# Patient Record
Sex: Female | Born: 1960
Health system: Southern US, Community
[De-identification: ages and names within clinical notes are randomized; demographics above are authoritative.]

## PROBLEM LIST (undated history)

## (undated) DIAGNOSIS — M1990 Unspecified osteoarthritis, unspecified site: Secondary | ICD-10-CM

## (undated) DIAGNOSIS — R748 Abnormal levels of other serum enzymes: Secondary | ICD-10-CM

## (undated) DIAGNOSIS — E7800 Pure hypercholesterolemia, unspecified: Secondary | ICD-10-CM

## (undated) DIAGNOSIS — I10 Essential (primary) hypertension: Secondary | ICD-10-CM

## (undated) DIAGNOSIS — G5600 Carpal tunnel syndrome, unspecified upper limb: Secondary | ICD-10-CM

## (undated) DIAGNOSIS — J45909 Unspecified asthma, uncomplicated: Secondary | ICD-10-CM

## (undated) DIAGNOSIS — E119 Type 2 diabetes mellitus without complications: Secondary | ICD-10-CM

## (undated) DIAGNOSIS — K760 Fatty (change of) liver, not elsewhere classified: Secondary | ICD-10-CM

## (undated) HISTORY — DX: Fatty (change of) liver, not elsewhere classified: K76.0

## (undated) HISTORY — DX: Pure hypercholesterolemia, unspecified: E78.00

## (undated) HISTORY — PX: TUBAL LIGATION: SHX77

## (undated) HISTORY — DX: Carpal tunnel syndrome, unspecified upper limb: G56.00

## (undated) HISTORY — DX: Abnormal levels of other serum enzymes: R74.8

---

## 2001-08-04 ENCOUNTER — Encounter: Payer: Self-pay | Admitting: Internal Medicine

## 2001-08-04 ENCOUNTER — Encounter: Admission: RE | Admit: 2001-08-04 | Discharge: 2001-08-04 | Payer: Self-pay | Admitting: Internal Medicine

## 2006-12-29 ENCOUNTER — Ambulatory Visit: Payer: Self-pay | Admitting: Pulmonary Disease

## 2007-01-05 DIAGNOSIS — J302 Other seasonal allergic rhinitis: Secondary | ICD-10-CM | POA: Insufficient documentation

## 2007-01-05 DIAGNOSIS — R05 Cough: Secondary | ICD-10-CM | POA: Insufficient documentation

## 2007-01-05 DIAGNOSIS — R059 Cough, unspecified: Secondary | ICD-10-CM | POA: Insufficient documentation

## 2007-01-21 ENCOUNTER — Ambulatory Visit: Payer: Self-pay | Admitting: Pulmonary Disease

## 2007-02-28 ENCOUNTER — Telehealth (INDEPENDENT_AMBULATORY_CARE_PROVIDER_SITE_OTHER): Payer: Self-pay | Admitting: *Deleted

## 2009-04-05 ENCOUNTER — Ambulatory Visit (HOSPITAL_COMMUNITY): Admission: RE | Admit: 2009-04-05 | Discharge: 2009-04-05 | Payer: Self-pay | Admitting: Family Medicine

## 2009-06-10 ENCOUNTER — Emergency Department (HOSPITAL_COMMUNITY): Admission: EM | Admit: 2009-06-10 | Discharge: 2009-06-10 | Payer: Self-pay | Admitting: Emergency Medicine

## 2009-06-10 ENCOUNTER — Encounter: Payer: Self-pay | Admitting: Orthopedic Surgery

## 2009-06-17 ENCOUNTER — Encounter (INDEPENDENT_AMBULATORY_CARE_PROVIDER_SITE_OTHER): Payer: Self-pay | Admitting: *Deleted

## 2009-06-17 ENCOUNTER — Ambulatory Visit: Payer: Self-pay | Admitting: Orthopedic Surgery

## 2009-06-17 DIAGNOSIS — M25819 Other specified joint disorders, unspecified shoulder: Secondary | ICD-10-CM | POA: Insufficient documentation

## 2009-06-17 DIAGNOSIS — M5412 Radiculopathy, cervical region: Secondary | ICD-10-CM | POA: Insufficient documentation

## 2009-06-17 DIAGNOSIS — M7580 Other shoulder lesions, unspecified shoulder: Secondary | ICD-10-CM

## 2009-07-02 ENCOUNTER — Encounter (INDEPENDENT_AMBULATORY_CARE_PROVIDER_SITE_OTHER): Payer: Self-pay | Admitting: *Deleted

## 2009-07-02 ENCOUNTER — Ambulatory Visit: Payer: Self-pay | Admitting: Orthopedic Surgery

## 2009-07-05 ENCOUNTER — Encounter: Payer: Self-pay | Admitting: Orthopedic Surgery

## 2009-07-05 ENCOUNTER — Telehealth: Payer: Self-pay | Admitting: Orthopedic Surgery

## 2009-07-09 ENCOUNTER — Encounter: Admission: RE | Admit: 2009-07-09 | Discharge: 2009-07-09 | Payer: Self-pay | Admitting: Orthopedic Surgery

## 2009-07-12 ENCOUNTER — Telehealth: Payer: Self-pay | Admitting: Orthopedic Surgery

## 2009-07-15 ENCOUNTER — Encounter: Payer: Self-pay | Admitting: Orthopedic Surgery

## 2009-07-15 ENCOUNTER — Telehealth: Payer: Self-pay | Admitting: Orthopedic Surgery

## 2009-07-18 ENCOUNTER — Encounter (INDEPENDENT_AMBULATORY_CARE_PROVIDER_SITE_OTHER): Payer: Self-pay | Admitting: *Deleted

## 2009-07-18 ENCOUNTER — Telehealth: Payer: Self-pay | Admitting: Orthopedic Surgery

## 2009-07-19 ENCOUNTER — Encounter (INDEPENDENT_AMBULATORY_CARE_PROVIDER_SITE_OTHER): Payer: Self-pay | Admitting: *Deleted

## 2009-07-29 ENCOUNTER — Telehealth (INDEPENDENT_AMBULATORY_CARE_PROVIDER_SITE_OTHER): Payer: Self-pay | Admitting: *Deleted

## 2009-08-01 ENCOUNTER — Encounter: Admission: RE | Admit: 2009-08-01 | Discharge: 2009-08-01 | Payer: Self-pay | Admitting: Orthopedic Surgery

## 2010-03-04 NOTE — Assessment & Plan Note (Signed)
Summary: [facility] ER FOL/UP/RT SHOULDER PAIN/XR AP 06/10/09/BCBS/CAF   Vital Signs:  Patient profile:   50 year old female Height:      68 inches Weight:      287 pounds Pulse rate:   80 / minute Resp:     16 per minute  Visit Type:  new patient Referring Provider:  ap er Primary Provider:  Dr. Renard Matter  CC:  right shoulder pain.  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  Pain is relieved by lying flat on her back.  She reports a deep throbbing sharp pain which is a great 10.  She went to the emergency room she was started on Ultram ER and ibuprofen 800 mg which did not help.  She denies numbness.  The pain is constant and worse when she bends over and when she reaches up to pick something up.  Pain started May 9  AP xrays rt shoulder and c spine 06/10/09 for review.  Meds: none.   Allergies: 1)  ! Sulfa  Past History:  Past Medical History: na  Past Surgical History: tubal ligation  Family History: Family History Coronary Heart Disease female < 28 Hx, family, asthma  Social History: Patient is married.  spinner no smoking no alcohol caffeine use daily regularly  Review of Systems Constitutional:  Denies weight loss, weight gain, fever, chills, and fatigue. Cardiovascular:  Denies chest pain, palpitations, fainting, and murmurs. Respiratory:  Complains of wheezing and couch; denies short of breath, tightness, pain on inspiration, and snoring . Gastrointestinal:  Denies heartburn, nausea, vomiting, diarrhea, constipation, and blood in your stools. Genitourinary:  Denies frequency, urgency, difficulty urinating, painful urination, flank pain, and bleeding in urine. Neurologic:  Complains of numbness; denies tingling, unsteady gait, dizziness, tremors, and seizure. Musculoskeletal:  Complains of muscle pain; denies joint  pain, swelling, instability, stiffness, redness, and heat. Endocrine:  Denies excessive thirst, exessive urination, and heat or cold intolerance. Psychiatric:  Denies nervousness, depression, anxiety, and hallucinations. Skin:  Denies changes in the skin, poor healing, rash, itching, and redness. HEENT:  Denies blurred or double vision, eye pain, redness, and watering. Immunology:  Complains of seasonal allergies; denies sinus problems and allergic to bee stings. Hemoatologic:  Denies easy bleeding and brusing.  Physical Exam  Additional Exam:  Constitutional: vital signs see recorded values. General: normal development, nutrition, and grooming. No deformity. Body Habitus is large CDV: Observation and palpation was normal  Lymph: palpation of the lymph nodes were normal Skin: inspection and palpation of the skin revealed no abnormalities  Neuro: coordination: normal              DTR's normal              Sensation was normal  Psyche: Alert and oriented x 3. Mood was normal.  Affect: normal  MSK: Gait: normal   She really had painless range of motion in the RIGHT shoulder and it was full.  There is no strength deficit.  There is no instability.  There is no tenderness  The cervical spine exam was abnormal with tenderness at the base of the cervical spine pain with flexion and cervical spine pain with extension referred to the cervical spine pain with rotation to the RIGHT, there is a positive Spurling sign is increased muscle tension on the RIGHT     Impression &  Recommendations:  Problem # 1:  CERVICAL RADICULITIS (ICD-723.4) Assessment New  x-rays cervical spine normal x-rays shoulder normal these were done at the hospital  Differential diagnoses cervical radiculopathy, RIGHT shoulder impingement  Orders: New Patient Level III (20254)  Problem # 2:  IMPINGEMENT SYNDROME (ICD-726.2) Assessment: New  recommend nonoperative treatment with a prednisone dosepak and Neurontin 100  mg t.i.d. followup 2 weeks  Orders: New Patient Level III (27062)  Medications Added to Medication List This Visit: 1)  Prednisone (pak) 10 Mg Tabs (Prednisone) .... As directed for 12 days 2)  Neurontin 100 Mg Caps (Gabapentin) .... One by mouth tid  Patient Instructions: 1)  Try new medications 2)  come back in 2 weeks to recheck neck after meds 3)  note needs to be from 06/10/09 til 07/01/09 Prescriptions: NEURONTIN 100 MG CAPS (GABAPENTIN) one by mouth tid  #90 x 0   Entered and Authorized by:   Fuller Canada MD   Signed by:   Fuller Canada MD on 06/17/2009   Method used:   Faxed to ...       [facility] (retail)       [address]       [place], [place]  [address]       Ph: [phone]       Fax: [phone]   RxID:   [id number] PREDNISONE (PAK) 10 MG TABS (PREDNISONE) as directed for 12 days  #1 pack x 0   Entered and Authorized by:   Fuller Canada MD   Signed by:   Fuller Canada MD on 06/17/2009   Method used:   Faxed to ...       [facility] (retail)       [address]       [place], [place]  [address]       Ph: [phone]       Fax: [phone]   RxID:   [id number]

## 2010-03-04 NOTE — Progress Notes (Signed)
  Phone Note Call from Patient   Summary of Call: Patient called about her appointment that was scheduled for MRI  results for 07/30/09.  She said she had been told by someone here that she did not need this appointment. She said she is going for her first ESI on Thursday 07/05/09.  Spoke with Dr. Romeo Apple about this and he said to cancel the appointment here, since she is already scheduled for the [facility].  Patient is aware that the appointment has been cancelled Initial call taken by: Jacklynn Ganong,  July 29, 2009 3:57 PM

## 2010-03-04 NOTE — Letter (Signed)
Summary: Out of Work  [facility]  [address]  [place], [place] [address]   Phone: [phone]  Fax: [phone]    Jun 17, 2009   Employee:  MAKENZE ELLETT    To Whom It May Concern:   For Medical reasons, please excuse the above named employee from work for the following dates:  Start:        Jun 10, 2009  End/Return to work:  Jul 02, 2009  (Next appointment scheduled Jul 02, 2009 4:15pm)  If you need additional information, please feel free to contact our office.         Sincerely,    Terrance Mass, MD

## 2010-03-04 NOTE — Miscellaneous (Signed)
Summary: c 6-7 esi order  Clinical Lists Changes  Orders: Added new Referral order of Misc. Referral (Misc. Ref) - Signed

## 2010-03-04 NOTE — Miscellaneous (Signed)
Summary: faxed info to [facility] for esi  Clinical Lists Changes

## 2010-03-04 NOTE — Letter (Signed)
Summary: Out of Work  [facility]  [address]  [place], [place] [address]   Phone: [phone]  Fax: [phone]    Jul 02, 2009   Employee:  Haley Mcgrath    To Whom It May Concern:   For Medical reasons, please excuse the above named employee from work for the following dates:  Start:   Jul 02, 2009  End:   August 18, 2009 *  *Further testing pending.  Follow up appointment pending results.    If you need additional information, please feel free to contact our office.         Sincerely,    Terrance Mass, MD

## 2010-03-04 NOTE — Letter (Signed)
Summary: History form  History form   Imported By: Jacklynn Ganong 06/18/2009 11:49:47  _____________________________________________________________________  External Attachment:    Type:   Image     Comment:   External Document

## 2010-03-04 NOTE — Progress Notes (Signed)
Summary: Call to patient, MRI results and new referral  ---- Converted from flag ---- ---- 07/18/2009 9:07 AM, Fuller Canada MD wrote: return to work, she will call to give you the date  C6-7 ESI ASAP ------------------------------

## 2010-03-04 NOTE — Progress Notes (Signed)
Summary: MRI appointment.  Phone Note Outgoing Call   Call placed by: Waldon Reining,  July 05, 2009 10:37 AM Call placed to: Patient Action Taken: Appt scheduled Summary of Call: I called to give the patient her MRI appointment at [facility] on 07-09-09 at 7:30 am. Patient is aware to bring a copy of her films. Patient has [employer]BS, no precert is needed.

## 2010-03-04 NOTE — Progress Notes (Signed)
Summary: Appt re-scheduled and forms re-faxed  Phone Note Call from Patient   Caller: Patient Summary of Call: Patient called about appt for MRi results (missed it today) and about forms. Re-sched'd her appointment and re-faxed disability forms to [facility] faxed by [facility] to Fax [phone], ph [phone] Initial call taken by: Cammie Sickle,  July 15, 2009 5:45 PM

## 2010-03-04 NOTE — Letter (Signed)
Summary: [employer] disab form re-fax  [employer] disab form re-fax   Imported By: Cammie Sickle 07/16/2009 09:09:57  _____________________________________________________________________  External Attachment:    Type:   Image     Comment:   External Document

## 2010-03-04 NOTE — Letter (Signed)
Summary: Out of Work  [facility]  [address]  [place], [place] [address]   Phone: [phone]  Fax: [phone]    July 18, 2009   Employee:  Haley Mcgrath    To Whom It May Concern:   For Medical reasons, please excuse the above named employee from work for the following dates:  Start:   Jul 02, 2009  End:   July 22, 2009   If you need additional information, please feel free to contact our office.         Sincerely,    Terrance Mass, MD

## 2010-03-04 NOTE — Letter (Signed)
Summary: [employer] disab form  [employer] disab form   Imported By: Cammie Sickle 07/16/2009 09:03:54  _____________________________________________________________________  External Attachment:    Type:   Image     Comment:   External Document

## 2010-03-04 NOTE — Progress Notes (Signed)
Summary: mri results  Phone Note Call from Patient Call back at Home Phone [phone]   Summary of Call: pt called wanting MRI results Initial call taken by: Chasity Tereasa Coop,  July 12, 2009 11:09 AM

## 2010-03-04 NOTE — Assessment & Plan Note (Signed)
Summary: 2 WK RE-CK RT SHOULDER/NECK/BCBS/CAF   Visit Type:  Follow-up Referring Provider:  ap er Primary Provider:  Dr. Renard Matter  CC:  right shoulder pain.  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  Pain is relieved by lying flat on her back.  She reports a deep throbbing sharp pain which is a great 10.  She went to the emergency room she was started on Ultram ER and ibuprofen 800 mg which did not help.  She denies numbness.  The pain is constant and worse when she bends over and when she reaches up to pick something up.  Pain started May 9  AP xrays rt shoulder and c spine 06/10/09 for review.  Meds: Ibuprofen 800 mg as needed.  Today is 2 week recheck right shoulder after taking Prednisone 10 and Neurontin 100 tid for cervical radiculitis.  Still has aching running down right arm but not below the wrist   Cervical pain improved the arm pain did not     Allergies (verified): 1)  ! 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 Musculoskeletal:  See HPI.  Physical Exam  Additional Exam:  Normal Appearance, Oriented x 3, Mood normal  Stability was normal  Motor grade was  Skin was intact no rash  She still has a significant amount of pain with forward elevation.  She is nontender in the cervical spine.  She can perform active forward elevation without too much pain     Impression &  Recommendations:  Problem # 1:  CERVICAL RADICULITIS (ICD-723.4)  MRI to evaluate for cervical disc  Orders: Est. Patient Level III ([address])  Problem # 2:  IMPINGEMENT SYNDROME (ICD-726.2)  injected RIGHT shoulder subacromial space Verbal consent obtained/The shoulder was injected with depomedrol 40mg /cc and sensorcaine .25% . There were no complications  Orders: Est. Patient Level III (60454) Depo- Medrol 40mg  (J1030) Joint Aspirate / Injection, Large (20610)  Medications Added to Medication List This Visit: 1)  Norco 5-325 Mg Tabs (Hydrocodone-acetaminophen) .Marland Kitchen.. 1 every 4 hrs as needed pain  Patient Instructions: 1)  You have received an injection of cortisone today. You may experience increased pain at the injection site. Apply ice pack to the area for 20 minutes every 2 hours and take 2 xtra strength tylenol every 8 hours. This increased pain will usually resolve in 24 hours. The injection will take effect in 3-10 days.   2)  MRI C SPINE  3)  new pain medication  4)  and increase the neurontin to 2 tab every 8 hrs  Prescriptions: NORCO 5-325 MG TABS (HYDROCODONE-ACETAMINOPHEN) 1 every 4 hrs as needed pain  #42 x 5   Entered and Authorized by:   Fuller Canada MD   Signed by:   Fuller Canada MD on 07/02/2009   Method used:   Print then Give to Patient   RxID:   [id number]

## 2011-10-22 ENCOUNTER — Emergency Department (HOSPITAL_COMMUNITY)
Admission: EM | Admit: 2011-10-22 | Discharge: 2011-10-22 | Disposition: A | Discharge status: Home / Self Care | Payer: BC Managed Care – PPO | Attending: Emergency Medicine | Admitting: Emergency Medicine

## 2011-10-22 ENCOUNTER — Encounter (HOSPITAL_COMMUNITY): Payer: Self-pay | Admitting: Emergency Medicine

## 2011-10-22 ENCOUNTER — Emergency Department (HOSPITAL_COMMUNITY): Payer: BC Managed Care – PPO

## 2011-10-22 DIAGNOSIS — M542 Cervicalgia: Secondary | ICD-10-CM | POA: Insufficient documentation

## 2011-10-22 DIAGNOSIS — J329 Chronic sinusitis, unspecified: Secondary | ICD-10-CM

## 2011-10-22 DIAGNOSIS — J40 Bronchitis, not specified as acute or chronic: Secondary | ICD-10-CM

## 2011-10-22 HISTORY — DX: Unspecified osteoarthritis, unspecified site: M19.90

## 2011-10-22 MED ORDER — ALBUTEROL SULFATE (5 MG/ML) 0.5% IN NEBU
5.0000 mg | INHALATION_SOLUTION | Freq: Once | RESPIRATORY_TRACT | Status: DC
  Filled 2011-10-22: qty 1

## 2011-10-22 MED ORDER — SODIUM CHLORIDE 0.9 % IN NEBU
INHALATION_SOLUTION | RESPIRATORY_TRACT | Status: AC
  Filled 2011-10-22: qty 3

## 2011-10-22 MED ORDER — HYDROCODONE-ACETAMINOPHEN 5-325 MG PO TABS: ORAL_TABLET | ORAL | Status: DC

## 2011-10-22 MED ORDER — METHYLPREDNISOLONE SODIUM SUCC 125 MG IJ SOLR
125.0000 mg | Freq: Once | INTRAMUSCULAR | Status: AC
  Administered 2011-10-22: 125 mg via INTRAMUSCULAR
  Filled 2011-10-22: qty 2

## 2011-10-22 MED ORDER — PSEUDOEPHEDRINE HCL 60 MG PO TABS: ORAL_TABLET | ORAL | Status: DC

## 2011-10-22 MED ORDER — ALBUTEROL SULFATE HFA 108 (90 BASE) MCG/ACT IN AERS
INHALATION_SPRAY | RESPIRATORY_TRACT | Status: AC
  Administered 2011-10-22: 2 via RESPIRATORY_TRACT
  Filled 2011-10-22: qty 6.7

## 2011-10-22 MED ORDER — ALBUTEROL SULFATE HFA 108 (90 BASE) MCG/ACT IN AERS
2.0000 | INHALATION_SPRAY | RESPIRATORY_TRACT | Status: DC
  Administered 2011-10-22 (×2): 2 via RESPIRATORY_TRACT
  Filled 2011-10-22: qty 6.7

## 2011-10-22 NOTE — ED Provider Notes (Signed)
History     CSN: [id number]  Arrival date & time 10/22/11  0900   First MD Initiated Contact with Patient 10/22/11 1005      Chief Complaint  Patient presents with  . Headache  . Neck Pain    (Consider location/radiation/quality/duration/timing/severity/associated sxs/prior treatment) Patient is a 51 y.o. female presenting with cough. The history is provided by the patient.  Cough This is a new problem. The current episode started more than 1 week ago. The problem occurs hourly. The problem has been gradually worsening. The cough is non-productive. There has been no fever. Associated symptoms include chills and myalgias. Pertinent negatives include no shortness of breath and no wheezing. She has tried decongestants and cough syrup for the symptoms. The treatment provided no relief. She is not a smoker. Her past medical history is significant for bronchitis.    Past Medical History  Diagnosis Date  . Arthritis     bilateral in hands    Past Surgical History  Procedure Date  . Tubal ligation     No family history on file.  History  Substance Use Topics  . Smoking status: Never Smoker   . Smokeless tobacco: Never Used  . Alcohol Use: No    OB History    Grav Para Term Preterm Abortions TAB SAB Ect Mult Living                  Review of Systems  Constitutional: Positive for chills. Negative for activity change.       All ROS Neg except as noted in HPI  HENT: Negative for nosebleeds.   Eyes: Negative for discharge.  Respiratory: Positive for cough. Negative for shortness of breath and wheezing.   Cardiovascular: Negative for palpitations.  Gastrointestinal: Negative for abdominal pain and blood in stool.  Genitourinary: Negative for dysuria, frequency and hematuria.  Musculoskeletal: Positive for myalgias and arthralgias. Negative for back pain.  Skin: Negative.   Neurological: Negative for dizziness, seizures and speech difficulty.  Psychiatric/Behavioral:  Negative for hallucinations and confusion.    Allergies  Sulfonamide derivatives  Home Medications  No current outpatient prescriptions on file.  BP 158/92  Pulse 81  Temp 98.2 F (36.8 C) (Oral)  Resp 20  Ht 5\' 7"  (1.702 m)  SpO2 100%  LMP 10/09/2011  Physical Exam  Nursing note and vitals reviewed. Constitutional: She is oriented to person, place, and time. She appears well-developed and well-nourished.  Non-toxic appearance.  HENT:  Head: Normocephalic.  Right Ear: Tympanic membrane and external ear normal.  Left Ear: Tympanic membrane and external ear normal.       Nasal  Congestion present  Eyes: EOM and lids are normal. Pupils are equal, round, and reactive to light.  Neck: Normal range of motion. Neck supple. Carotid bruit is not present.       Mild soreness with ROM. No rigidity.  Cardiovascular: Normal rate, regular rhythm, normal heart sounds, intact distal pulses and normal pulses.   Pulmonary/Chest: Breath sounds normal. No respiratory distress.       Course breath sounds. No focal consolidation. Few scattered rhonchi.  Abdominal: Soft. Bowel sounds are normal. There is no tenderness. There is no guarding.  Musculoskeletal: Normal range of motion.  Lymphadenopathy:       Head (right side): No submandibular adenopathy present.       Head (left side): No submandibular adenopathy present.    She has no cervical adenopathy.  Neurological: She is alert and oriented to person, place,  and time. She has normal strength. No cranial nerve deficit or sensory deficit.  Skin: Skin is warm and dry.  Psychiatric: She has a normal mood and affect. Her speech is normal.    ED Course  Procedures (including critical care time)  Labs Reviewed - No data to display Dg Chest 2 View  10/22/2011  *RADIOLOGY REPORT*  Clinical Data: Headache, cough, congestion, shortness of breath for several weeks  CHEST - 2 VIEW  Comparison: Chest x-ray of 11/06/2010  Findings: The lungs are clear.   Mediastinal contours appear stable. The heart is within normal limits in size.  No bony abnormality is noted.  IMPRESSION: Stable chest x-ray.  No active lung disease.   Original Report Authenticated By: Juline Patch, M.D.      No diagnosis found.    MDM  I have reviewed nursing notes, vital signs, and all appropriate lab and imaging results for this patient. Chest xray is negative for pneumonia or active disease. Albuterol inhaler given to patient. Rx for sudafed and norco given. Pt to see her MD at Urgent Care for recheck if not improving.       Kathie Dike, [place] 10/22/11 1029

## 2011-10-22 NOTE — ED Notes (Signed)
Pt went to urgent care and was dx with sinus infection, states that now she is having head pain that is radiating down her neck on the Rt side. Pt is having a non-productive cough. Pt reports pain at 8/10.

## 2011-10-22 NOTE — ED Provider Notes (Signed)
Medical screening examination/treatment/procedure(s) were performed by non-physician practitioner and as supervising physician I was immediately available for consultation/collaboration.   Diala Waxman L Eason Housman, MD 10/22/11 1335 

## 2011-11-13 ENCOUNTER — Ambulatory Visit: Payer: Self-pay | Admitting: Family Medicine

## 2011-11-26 ENCOUNTER — Ambulatory Visit (INDEPENDENT_AMBULATORY_CARE_PROVIDER_SITE_OTHER): Payer: BC Managed Care – PPO | Admitting: Family Medicine

## 2011-11-26 ENCOUNTER — Encounter: Payer: Self-pay | Admitting: Family Medicine

## 2011-11-26 VITALS — BP 140/84 | HR 86 | Resp 18 | Ht 66.5 in | Wt 289.1 lb

## 2011-11-26 DIAGNOSIS — T7840XA Allergy, unspecified, initial encounter: Secondary | ICD-10-CM

## 2011-11-26 DIAGNOSIS — J329 Chronic sinusitis, unspecified: Secondary | ICD-10-CM

## 2011-11-26 NOTE — Progress Notes (Signed)
  Subjective:    Patient ID: [name], female    DOB: 02-20-1960, 51 y.o.   MRN: [id number]  HPI  Patient here to establish care. No previous PCP. She has not had a Pap exam in greater than 7 years. She has been seen in urgent care 3 times this year for sinus drainage and bronchitis. She was seen in emergency room once for sinusitis. Her current symptoms include small amount of nasal drainage watery eyes and sneezing. She denies shortness of breath. Denies fever. Taking OTC zyrtec Review of Systems - per above  GEN- denies fatigue, fever, weight loss,weakness, recent illness HEENT- denies eye drainage, change in vision, nasal discharge, CVS- denies chest pain, palpitations RESP- denies SOB, cough, wheeze ABD- denies N/V, change in stools, abd pain GU- denies dysuria, hematuria, dribbling, incontinence MSK- denies joint pain, muscle aches, injury Neuro- denies headache, dizziness, syncope, seizure activity      Objective:   Physical Exam GEN- NAD, alert and oriented x3, obese HEENT- PERRL, EOMI, non injected sclera, pink conjunctiva, MMM, oropharynx clear, nares clear, no maxillary sinus tenderness, TM clear bialt  Neck- Supple, no thryomegaly CVS- RRR, no murmur RESP-CTAB ABS-NABS,soft,NT,ND EXT- trace pedal edema Pulses- Radial, DP- 2+ Psych-normal affect and Mood       Assessment & Plan:

## 2011-11-26 NOTE — Patient Instructions (Addendum)
F/U 8 weeks for Physical with PAP Smear Do not eat after 12:30pm  Take the allergy pill once a day

## 2011-11-27 DIAGNOSIS — J329 Chronic sinusitis, unspecified: Secondary | ICD-10-CM | POA: Insufficient documentation

## 2011-11-27 DIAGNOSIS — E669 Obesity, unspecified: Secondary | ICD-10-CM | POA: Insufficient documentation

## 2011-11-27 NOTE — Assessment & Plan Note (Signed)
She has not had CT max of sinus, may need this, will have her take allergy pill daily to see if this helps symptoms

## 2011-11-27 NOTE — Assessment & Plan Note (Signed)
Per above daily allergy pill

## 2011-11-27 NOTE — Assessment & Plan Note (Signed)
She is to return for CPE, FLP and labs will be done at that visit

## 2011-12-03 ENCOUNTER — Encounter: Payer: Self-pay | Admitting: Family Medicine

## 2011-12-03 ENCOUNTER — Ambulatory Visit (INDEPENDENT_AMBULATORY_CARE_PROVIDER_SITE_OTHER): Payer: BC Managed Care – PPO | Admitting: Family Medicine

## 2011-12-03 VITALS — BP 140/90 | HR 80 | Temp 97.8°F | Resp 16 | Ht 66.5 in | Wt 283.0 lb

## 2011-12-03 DIAGNOSIS — J019 Acute sinusitis, unspecified: Secondary | ICD-10-CM

## 2011-12-03 DIAGNOSIS — J069 Acute upper respiratory infection, unspecified: Secondary | ICD-10-CM

## 2011-12-03 MED ORDER — FLUTICASONE PROPIONATE 50 MCG/ACT NA SUSP: 1.0000 | Freq: Every day | NASAL | Status: DC

## 2011-12-03 MED ORDER — AMOXICILLIN 500 MG PO CAPS: 500.0000 mg | ORAL_CAPSULE | Freq: Two times a day (BID) | ORAL | Status: AC

## 2011-12-03 MED ORDER — GUAIFENESIN-CODEINE 100-10 MG/5ML PO SYRP: 5.0000 mL | ORAL_SOLUTION | Freq: Three times a day (TID) | ORAL | Status: DC | PRN

## 2011-12-03 NOTE — Assessment & Plan Note (Signed)
Cough medication given, fluids, rest

## 2011-12-03 NOTE — Patient Instructions (Signed)
Treating for sinus infection  Take antibiotics, use nose spray Plenty of fluids  Cough medicine Keep previous f/u appointment

## 2011-12-03 NOTE — Progress Notes (Signed)
  Subjective:    Patient ID: Haley Mcgrath, female    DOB: [birth year], 51 y.o.   MRN: [id number]  HPI Patient presents with sinus pressure and drainage as well as congestion for the past week which is been worsening. She also admits to cough with mild production. Denies any wheeze or shortness of breath. She's soreness in her chest from coughing so much. She admits to fatigue and feeling nauseous but denies vomiting or diarrhea. No sick contacts. Has history of recurrent sinusitis. Has been taking her allergy pill   Review of Systems - per above  GEN- + fatigue, fever, weight loss,weakness, recent illness HEENT- denies eye drainage, change in vision, +nasal discharge, CVS- denies chest pain, palpitations RESP- denies SOB, +cough, wheeze ABD- + N/ denies V, change in stools, abd pain Neuro- + headache,denies dizziness, syncope, seizure activity      Objective:   Physical Exam GEN- NAD, alert and oriented x3 HEENT- PERRL, EOMI, non injected sclera, pink conjunctiva, MMM, oropharynx no injection, TM clear bilat no effusion, + maxillary sinus tenderness, inflammed turbinates,+  Nasal drainage  Neck- Supple, no LAD CVS- RRR, no murmur RESP-CTAB EXT- trace edema Pulses- Radial 2+          Assessment & Plan:

## 2011-12-03 NOTE — Assessment & Plan Note (Signed)
History of recurrent symptoms, had mild symptoms at our initial visit, now worsened, treat with amox, flonase,

## 2012-01-21 ENCOUNTER — Ambulatory Visit (INDEPENDENT_AMBULATORY_CARE_PROVIDER_SITE_OTHER): Payer: BC Managed Care – PPO | Admitting: Family Medicine

## 2012-01-21 ENCOUNTER — Encounter: Payer: Self-pay | Admitting: Family Medicine

## 2012-01-21 ENCOUNTER — Other Ambulatory Visit (HOSPITAL_COMMUNITY)
Admission: RE | Admit: 2012-01-21 | Discharge: 2012-01-21 | Disposition: A | Discharge status: Home / Self Care | Payer: BC Managed Care – PPO | Source: Ambulatory Visit | Attending: Family Medicine | Admitting: Family Medicine

## 2012-01-21 VITALS — BP 160/98 | HR 82 | Resp 16 | Ht 66.5 in | Wt 284.0 lb

## 2012-01-21 DIAGNOSIS — Z1239 Encounter for other screening for malignant neoplasm of breast: Secondary | ICD-10-CM

## 2012-01-21 DIAGNOSIS — Z13220 Encounter for screening for lipoid disorders: Secondary | ICD-10-CM

## 2012-01-21 DIAGNOSIS — Z130 Encounter for screening for diseases of the blood and blood-forming organs and certain disorders involving the immune mechanism: Secondary | ICD-10-CM

## 2012-01-21 DIAGNOSIS — Z01419 Encounter for gynecological examination (general) (routine) without abnormal findings: Secondary | ICD-10-CM | POA: Insufficient documentation

## 2012-01-21 DIAGNOSIS — E785 Hyperlipidemia, unspecified: Secondary | ICD-10-CM

## 2012-01-21 DIAGNOSIS — Z1329 Encounter for screening for other suspected endocrine disorder: Secondary | ICD-10-CM

## 2012-01-21 DIAGNOSIS — Z0000 Encounter for general adult medical examination without abnormal findings: Secondary | ICD-10-CM

## 2012-01-21 DIAGNOSIS — Z1321 Encounter for screening for nutritional disorder: Secondary | ICD-10-CM

## 2012-01-21 DIAGNOSIS — I10 Essential (primary) hypertension: Secondary | ICD-10-CM

## 2012-01-21 DIAGNOSIS — Z1211 Encounter for screening for malignant neoplasm of colon: Secondary | ICD-10-CM

## 2012-01-21 DIAGNOSIS — Z23 Encounter for immunization: Secondary | ICD-10-CM

## 2012-01-21 LAB — COMPREHENSIVE METABOLIC PANEL
ALT: 35 U/L (ref 0–35)
Alkaline Phosphatase: 87 U/L (ref 39–117)
BUN: 14 mg/dL (ref 6–23)
Calcium: 9.3 mg/dL (ref 8.4–10.5)
Glucose, Bld: 87 mg/dL (ref 70–99)
Potassium: 4.6 mEq/L (ref 3.5–5.3)
Total Bilirubin: 0.6 mg/dL (ref 0.3–1.2)

## 2012-01-21 LAB — LIPID PANEL
Cholesterol: 215 mg/dL — ABNORMAL HIGH (ref 0–200)
HDL: 51 mg/dL (ref >39)
LDL Cholesterol: 147 mg/dL — ABNORMAL HIGH (ref 0–99)
Total CHOL/HDL Ratio: 4.2 Ratio
VLDL: 17 mg/dL (ref 0–40)

## 2012-01-21 LAB — CBC: MCH: 29.9 pg (ref 26.0–34.0)

## 2012-01-21 LAB — POC HEMOCCULT BLD/STL (OFFICE/1-CARD/DIAGNOSTIC): Fecal Occult Blood, POC: NEGATIVE

## 2012-01-21 MED ORDER — LISINOPRIL-HYDROCHLOROTHIAZIDE 10-12.5 MG PO TABS: 1.0000 | ORAL_TABLET | Freq: Every day | ORAL | Status: DC

## 2012-01-21 NOTE — Patient Instructions (Addendum)
Start blood pressure pill once a day I recommend eye visit once a year I recommend dental visit every 6 months Goal is to  Exercise 30 minutes 5 days a week We will send a letter with lab results  Start Calcium and Vit D 1000mg / 800IU Congratulations on the weight loss keep up the good work Call and schedule your Mammogram Colonoscopy Screening F/U 6 weeks for blood pressure

## 2012-01-21 NOTE — Progress Notes (Signed)
  Subjective:    Patient ID: Haley Mcgrath, female    DOB: 1960-10-08, 51 y.o.   MRN: [id number]  HPI Pt here for complete physical exam. She's not had a Pap smear mammogram greater than 7 years. She's also due for colonoscopy. She's due for fasting labs.   Review of Systems  GEN- denies fatigue, fever, weight loss,weakness, recent illness HEENT- denies eye drainage, change in vision, nasal discharge, CVS- denies chest pain, palpitations RESP- denies SOB, cough, wheeze ABD- denies N/V, change in stools, abd pain GU- denies dysuria, hematuria, dribbling, incontinence MSK- denies joint pain, muscle aches, injury Neuro- denies headache, dizziness, syncope, seizure activity      Objective:   Physical Exam GEN- NAD, alert and oriented x3, weight loss 5 lbs  HEENT- PERRL, EOMI, non injected sclera, pink conjunctiva, MMM, oropharynx clear Neck- Supple, no thyromegaly CVS- RRR, no murmur RESP-CTAB ABS-NABS,soft,NT,ND Breast- normal symmetry, no nipple inversion,no nipple drainage, no nodules or lumps felt, large size breast 52  Nodes- no axillary nodes GU- normal external genitalia, vaginal mucosa pink and moist, cervix visualized no growth, no blood form os, minimal thin clear discharge, no CMT, no ovarian masses, uterus normal size Rectum- normal tone, no external lesions, FOBT neg EXT- No edema Pulses- Radial, DP- 2+  EKG- NSR, few PVC noted, inverted t wave V5, V6        Assessment & Plan:   CPE- PAP Smear done, TDAP given, Mammogram to be scheduled   Fasting labs to be done , add calcium/Vit D

## 2012-01-22 ENCOUNTER — Telehealth: Payer: Self-pay | Admitting: Family Medicine

## 2012-01-22 LAB — VITAMIN D 25 HYDROXY (VIT D DEFICIENCY, FRACTURES): Vit D, 25-Hydroxy: 18 ng/mL — ABNORMAL LOW (ref 30–89)

## 2012-01-22 MED ORDER — ERGOCALCIFEROL 1.25 MG (50000 UT) PO CAPS: 50000.0000 [IU] | ORAL_CAPSULE | ORAL | Status: DC

## 2012-01-22 NOTE — Telephone Encounter (Signed)
Called and left message for patient with lab results.

## 2012-01-22 NOTE — Telephone Encounter (Signed)
Please give lab results, Vitamin D is very low She is needs to start the prescription strength once a week for 8 weeks  Cholesterol is a little high, no meds to be started low fat , low cholesterol diet needed  Kidney and liver function look okay

## 2012-01-24 DIAGNOSIS — I10 Essential (primary) hypertension: Secondary | ICD-10-CM | POA: Insufficient documentation

## 2012-01-24 DIAGNOSIS — E785 Hyperlipidemia, unspecified: Secondary | ICD-10-CM | POA: Insufficient documentation

## 2012-01-24 NOTE — Assessment & Plan Note (Signed)
Work on diet and exercise for now

## 2012-01-24 NOTE — Assessment & Plan Note (Signed)
Weight down a few pounds, continue encourage

## 2012-01-24 NOTE — Assessment & Plan Note (Signed)
Start lisinopril HCTZ daily

## 2012-01-25 ENCOUNTER — Encounter (INDEPENDENT_AMBULATORY_CARE_PROVIDER_SITE_OTHER): Payer: Self-pay | Admitting: *Deleted

## 2012-02-01 ENCOUNTER — Encounter: Payer: Self-pay | Admitting: Family Medicine

## 2012-02-01 ENCOUNTER — Ambulatory Visit (INDEPENDENT_AMBULATORY_CARE_PROVIDER_SITE_OTHER): Payer: BC Managed Care – PPO | Admitting: Family Medicine

## 2012-02-01 VITALS — BP 116/80 | HR 85 | Resp 18 | Ht 66.5 in | Wt 282.0 lb

## 2012-02-01 DIAGNOSIS — H109 Unspecified conjunctivitis: Secondary | ICD-10-CM

## 2012-02-01 DIAGNOSIS — J069 Acute upper respiratory infection, unspecified: Secondary | ICD-10-CM

## 2012-02-01 DIAGNOSIS — H00019 Hordeolum externum unspecified eye, unspecified eyelid: Secondary | ICD-10-CM

## 2012-02-01 MED ORDER — POLYMYXIN B-TRIMETHOPRIM 10000-0.1 UNIT/ML-% OP SOLN: 1.0000 [drp] | Freq: Four times a day (QID) | OPHTHALMIC | Status: DC | PRN

## 2012-02-01 NOTE — Patient Instructions (Addendum)
Eye Drop four times a day Warm compresses as much as possible to left eye Plenty of fluids Tussin for cough   Sty A sty (hordeolum) is an infection of a gland in the eyelid located at the base of the eyelash. A sty may develop a white or yellow head of pus. It can be puffy (swollen). Usually, the sty will burst and pus will come out on its own. They do not leave lumps in the eyelid once they drain. A sty is often confused with another form of cyst of the eyelid called a chalazion. Chalazions occur within the eyelid and not on the edge where the bases of the eyelashes are. They often are red, sore and then form firm lumps in the eyelid. CAUSES    Germs (bacteria).   Lasting (chronic) eyelid inflammation.  SYMPTOMS    Tenderness, redness and swelling along the edge of the eyelid at the base of the eyelashes.   Sometimes, there is a white or yellow head of pus. It may or may not drain.  DIAGNOSIS   An ophthalmologist will be able to distinguish between a sty and a chalazion and treat the condition appropriately.   TREATMENT    Styes are typically treated with warm packs (compresses) until drainage occurs.   In rare cases, medicines that kill germs (antibiotics) may be prescribed. These antibiotics may be in the form of drops, cream or pills.   If a hard lump has formed, it is generally necessary to do a small incision and remove the hardened contents of the cyst in a minor surgical procedure done in the office.   In suspicious cases, your caregiver may send the contents of the cyst to the lab to be certain that it is not a rare, but dangerous form of cancer of the glands of the eyelid.  HOME CARE INSTRUCTIONS    Wash your hands often and dry them with a clean towel. Avoid touching your eyelid. This may spread the infection to other parts of the eye.   Apply heat to your eyelid for 10 to 20 minutes, several times a day, to ease pain and help to heal it faster.   Do not squeeze the sty.  Allow it to drain on its own. Wash your eyelid carefully 3 to 4 times per day to remove any pus.  SEEK IMMEDIATE MEDICAL CARE IF:    Your eye becomes painful or puffy (swollen).   Your vision changes.   Your sty does not drain by itself within 3 days.   Your sty comes back within a short period of time, even with treatment.   You have redness (inflammation) around the eye.   You have a fever.  Document Released: 10/29/2004 Document Revised: 04/13/2011 Document Reviewed: 07/03/2008 [facility] Patient Information 2013 [place], [place].

## 2012-02-02 ENCOUNTER — Encounter: Payer: Self-pay | Admitting: Family Medicine

## 2012-02-02 DIAGNOSIS — H00019 Hordeolum externum unspecified eye, unspecified eyelid: Secondary | ICD-10-CM | POA: Insufficient documentation

## 2012-02-02 DIAGNOSIS — H109 Unspecified conjunctivitis: Secondary | ICD-10-CM | POA: Insufficient documentation

## 2012-02-02 NOTE — Assessment & Plan Note (Signed)
Her swelling is already drastically improved I do not see any actual facial swelling today. She's a sty in her upper left eyelid we'll continue to compresses if this does not resolve we'll get her to ophthalmology

## 2012-02-02 NOTE — Assessment & Plan Note (Signed)
Early viral illness no red flags she can use cough medicine as needed over-the-counter.

## 2012-02-02 NOTE — Progress Notes (Signed)
  Subjective:    Patient ID: Haley Mcgrath, female    DOB: September 29, 1960, 51 y.o.   MRN: [id number]  HPI  Patient presents with left eye swelling for the past 3 days. She states that she felt her eye burning chest and discharged the next day she will cut it was swollen now but has a hard lump at the top of it. She's been using warm compresses. She's had matting in her eyes meals discharge from the left eye. She's also had cough with minimal production and feels like the left side of her face was swollen. No sore throat, positive nasal discharge  Review of Systems - per above   GEN- denies fatigue, fever, weight loss,weakness, recent illness HEENT- denies eye drainage, change in vision, +nasal discharge, CVS- denies chest pain, palpitations RESP- denies SOB,+ cough, wheeze Neuro- denies headache, dizziness, syncope, seizure activity      Objective:   Physical Exam  GEN- NAD, alert and oriented x3 HEENT- PERRL, EOMI, non injected sclera, + injected left conjunctiva, MMM, oropharynx clear, TM clear bilat no effusion, no maxillary sinus tenderness, +inflammed turbinates,  +Nasal drainage, stye of left upper eye lid, no current drainage from eye   Neck- Supple, no LAD CVS- RRR, no murmur RESP-CTAB Ext- no edema Pulses- Radial 2+        Assessment & Plan:

## 2012-02-02 NOTE — Assessment & Plan Note (Signed)
We'll treat the unilateral conjunctivitis with Polytrim drops.

## 2012-03-03 ENCOUNTER — Ambulatory Visit: Payer: BC Managed Care – PPO | Admitting: Family Medicine

## 2012-03-07 ENCOUNTER — Ambulatory Visit (INDEPENDENT_AMBULATORY_CARE_PROVIDER_SITE_OTHER): Payer: BC Managed Care – PPO | Admitting: Family Medicine

## 2012-03-07 ENCOUNTER — Encounter: Payer: Self-pay | Admitting: Family Medicine

## 2012-03-07 VITALS — BP 122/80 | HR 88 | Resp 16 | Ht 66.5 in | Wt 279.0 lb

## 2012-03-07 DIAGNOSIS — R059 Cough, unspecified: Secondary | ICD-10-CM

## 2012-03-07 DIAGNOSIS — R05 Cough: Secondary | ICD-10-CM

## 2012-03-07 MED ORDER — HYDROCHLOROTHIAZIDE 25 MG PO TABS: 25.0000 mg | ORAL_TABLET | Freq: Every day | ORAL | Status: DC

## 2012-03-07 NOTE — Progress Notes (Signed)
  Subjective:    Patient ID: Haley Mcgrath, female    DOB: March 25, 1960, 52 y.o.   MRN: [id number]  HPI  Patient presents with dry hacking cough which is worsened over the past 3 months. 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 She states her sister had something similar was taken out of work for 2 months until they found out it was occupational exposure. Of note she was started on lisinopril HCTZ during the same time. She is a nonsmoker. She denies any acid reflux symptoms. She denies chest pain or shortness of breath.   Review of Systems  GEN- denies fatigue, fever, weight loss,weakness, recent illness HEENT- denies eye drainage, change in vision, nasal discharge, CVS- denies chest pain, palpitations RESP- denies SOB, +cough, wheeze ABD- denies N/V, change in stools, abd pain GU- denies dysuria, hematuria, dribbling, incontinence MSK- denies joint pain, muscle aches, injury Neuro- denies headache, dizziness, syncope, seizure activity      Objective:   Physical Exam GEN- NAD, alert and oriented x3 HEENT- PERRL, EOMI, non injected sclera, pink conjunctiva, MMM, oropharynx clear Neck- Supple,  CVS- RRR, no murmur RESP-CTAB EXT- No edema Pulses- Radial, DP- 2+        Assessment & Plan:

## 2012-03-07 NOTE — Patient Instructions (Signed)
Give number for Dr. Karilyn Cota for colon cancer screening Chest xray to be done Stop the Lisinopril- HCTZ Start HCTZ 25mg  once a day  I will call in 2 weeks times to see what is going on with cough  F/U 3 months regular OV

## 2012-03-09 DIAGNOSIS — R05 Cough: Secondary | ICD-10-CM | POA: Insufficient documentation

## 2012-03-09 DIAGNOSIS — R059 Cough, unspecified: Secondary | ICD-10-CM | POA: Insufficient documentation

## 2012-03-09 NOTE — Assessment & Plan Note (Signed)
Differentials, ACEI, GERD, possible work exposure, lung pathology. Non smoker  Trial of ACEI, Obtain CXR F/U 2 weeks via phone, if not improved refer to pulmonary

## 2012-03-11 ENCOUNTER — Ambulatory Visit (HOSPITAL_COMMUNITY)
Admission: RE | Admit: 2012-03-11 | Discharge: 2012-03-11 | Disposition: A | Discharge status: Home / Self Care | Payer: BC Managed Care – PPO | Source: Ambulatory Visit | Attending: Family Medicine | Admitting: Family Medicine

## 2012-03-11 DIAGNOSIS — R05 Cough: Secondary | ICD-10-CM | POA: Insufficient documentation

## 2012-03-11 DIAGNOSIS — R059 Cough, unspecified: Secondary | ICD-10-CM | POA: Insufficient documentation

## 2012-03-24 ENCOUNTER — Telehealth: Payer: Self-pay | Admitting: Family Medicine

## 2012-03-24 DIAGNOSIS — R05 Cough: Secondary | ICD-10-CM

## 2012-03-24 DIAGNOSIS — R053 Chronic cough: Secondary | ICD-10-CM

## 2012-03-24 NOTE — Telephone Encounter (Signed)
I called to check on patient's cough began she did not answer the phone. Advise her to call the nurse in the morning to know if the cough is improved or not. If it is still lingering around  I will send her to a lung Doctor to have this checked. Continue the hydrochlorothiazide only for her blood pressure

## 2012-03-24 NOTE — Telephone Encounter (Signed)
Called to check on patient cough, difficult to hear she was at work, states cough improved, off lisinopril, she will call back this afternoon

## 2012-03-30 ENCOUNTER — Encounter (INDEPENDENT_AMBULATORY_CARE_PROVIDER_SITE_OTHER): Payer: Self-pay | Admitting: *Deleted

## 2012-03-30 ENCOUNTER — Telehealth (INDEPENDENT_AMBULATORY_CARE_PROVIDER_SITE_OTHER): Payer: Self-pay | Admitting: *Deleted

## 2012-03-30 ENCOUNTER — Other Ambulatory Visit (INDEPENDENT_AMBULATORY_CARE_PROVIDER_SITE_OTHER): Payer: Self-pay | Admitting: *Deleted

## 2012-03-30 DIAGNOSIS — Z1211 Encounter for screening for malignant neoplasm of colon: Secondary | ICD-10-CM

## 2012-03-30 MED ORDER — PEG-KCL-NACL-NASULF-NA ASC-C 100 G PO SOLR: 1.0000 | Freq: Once | ORAL | Status: DC

## 2012-03-30 NOTE — Telephone Encounter (Signed)
Patient needs movi prep 

## 2012-04-03 ENCOUNTER — Other Ambulatory Visit: Payer: Self-pay | Admitting: Family Medicine

## 2012-04-06 ENCOUNTER — Telehealth (INDEPENDENT_AMBULATORY_CARE_PROVIDER_SITE_OTHER): Payer: Self-pay | Admitting: *Deleted

## 2012-04-06 NOTE — Telephone Encounter (Signed)
  Procedure: tcs  Reason/Indication:  screening  Has patient had this procedure before?  no  If so, when, by whom and where?    Is there a family history of colon cancer?  no  Who?  What age when diagnosed?    Is patient diabetic?   no      Does patient have prosthetic heart valve?  no  Do you have a pacemaker?  no  Has patient had joint replacement within last 12 months?  no  Is patient on Coumadin, Plavix and/or Aspirin? no  Medications: hctz 25 mg daily, cetirizine daily, multi vit daily, fluticasone daily  Allergies: sulfur  Medication Adjustment:    Procedure date & time: 05/05/12 at 1030

## 2012-04-07 NOTE — Telephone Encounter (Signed)
Agree 

## 2012-04-11 ENCOUNTER — Other Ambulatory Visit: Payer: Self-pay

## 2012-04-11 MED ORDER — HYDROCHLOROTHIAZIDE 25 MG PO TABS: ORAL_TABLET | ORAL | Status: DC

## 2012-04-11 NOTE — Telephone Encounter (Signed)
Please let her know she will be sent to the lung doctor

## 2012-04-11 NOTE — Telephone Encounter (Signed)
States she is still coughing and she wants to know if you want to see her in the office or if you just want to go ahead and send her to pulmonology.

## 2012-04-11 NOTE — Addendum Note (Signed)
Addended by: Milinda Antis F on: 04/11/2012 04:25 PM   Modules accepted: Orders

## 2012-04-13 NOTE — Telephone Encounter (Signed)
pts number disconnected. Will let her know if she calls back that she is being sent to lung Dr. Was told in office that she was probably going to be referred since she was still having coughing

## 2012-04-22 ENCOUNTER — Encounter (HOSPITAL_COMMUNITY): Payer: Self-pay | Admitting: Pharmacy Technician

## 2012-05-05 ENCOUNTER — Ambulatory Visit (HOSPITAL_COMMUNITY)
Admission: RE | Admit: 2012-05-05 | Discharge: 2012-05-05 | Disposition: A | Discharge status: Home / Self Care | Payer: BC Managed Care – PPO | Source: Ambulatory Visit | Attending: Internal Medicine | Admitting: Internal Medicine

## 2012-05-05 ENCOUNTER — Encounter (HOSPITAL_COMMUNITY)
Admission: RE | Disposition: A | Discharge status: Home / Self Care | Payer: Self-pay | Source: Ambulatory Visit | Attending: Internal Medicine

## 2012-05-05 ENCOUNTER — Encounter (HOSPITAL_COMMUNITY): Payer: Self-pay | Admitting: *Deleted

## 2012-05-05 ENCOUNTER — Telehealth: Payer: Self-pay | Admitting: Family Medicine

## 2012-05-05 DIAGNOSIS — K5730 Diverticulosis of large intestine without perforation or abscess without bleeding: Secondary | ICD-10-CM

## 2012-05-05 DIAGNOSIS — Z1211 Encounter for screening for malignant neoplasm of colon: Secondary | ICD-10-CM

## 2012-05-05 DIAGNOSIS — I10 Essential (primary) hypertension: Secondary | ICD-10-CM | POA: Insufficient documentation

## 2012-05-05 HISTORY — PX: COLONOSCOPY: SHX5424

## 2012-05-05 HISTORY — DX: Essential (primary) hypertension: I10

## 2012-05-05 HISTORY — DX: Unspecified asthma, uncomplicated: J45.909

## 2012-05-05 SURGERY — COLONOSCOPY
Anesthesia: Moderate Sedation

## 2012-05-05 MED ORDER — SODIUM CHLORIDE 0.45 % IV SOLN: INTRAVENOUS | Status: DC

## 2012-05-05 MED ORDER — MEPERIDINE HCL 50 MG/ML IJ SOLN
INTRAMUSCULAR | Status: DC | PRN
  Administered 2012-05-05 (×2): 25 mg via INTRAVENOUS

## 2012-05-05 MED ORDER — MEPERIDINE HCL 50 MG/ML IJ SOLN
INTRAMUSCULAR | Status: AC
  Filled 2012-05-05: qty 1

## 2012-05-05 MED ORDER — MIDAZOLAM HCL 5 MG/5ML IJ SOLN
INTRAMUSCULAR | Status: DC | PRN
  Administered 2012-05-05 (×4): 2 mg via INTRAVENOUS

## 2012-05-05 MED ORDER — SODIUM CHLORIDE 0.9 % IV SOLN
INTRAVENOUS | Status: DC
  Administered 2012-05-05: 10:00:00 via INTRAVENOUS

## 2012-05-05 MED ORDER — STERILE WATER FOR IRRIGATION IR SOLN
Status: DC | PRN
  Administered 2012-05-05: 11:00:00

## 2012-05-05 MED ORDER — MIDAZOLAM HCL 5 MG/5ML IJ SOLN
INTRAMUSCULAR | Status: AC
  Filled 2012-05-05: qty 10

## 2012-05-05 NOTE — H&P (Signed)
Haley Mcgrath is an 52 y.o. female.   Chief Complaint: Patient is here for colonoscopy. HPI: Patient is 52 year old African female who is here for screening colonoscopy. She denies abdominal pain change in bowel habits or rectal bleeding. Family history is negative for colorectal carcinoma.  Past Medical History  Diagnosis Date  . Arthritis     bilateral in hands  . Hypertension   . Asthma     history of childhood asthma    Past Surgical History  Procedure Laterality Date  . Tubal ligation      Family History  Problem Relation Age of Onset  . Heart disease Father   . Hypertension Father   . Colon cancer Neg Hx    Social History:  reports that she has never smoked. She has never used smokeless tobacco. She reports that she does not drink alcohol or use illicit drugs.  Allergies:  Allergies  Allergen Reactions  . Sulfonamide Derivatives Hives    Medications Prior to Admission  Medication Sig Dispense Refill  . fluticasone (FLONASE) 50 MCG/ACT nasal spray Place 1 spray into the nose daily.  16 g  2  . ibuprofen (ADVIL,MOTRIN) 200 MG tablet Take 200 mg by mouth every 6 (six) hours as needed for pain.      . peg 3350 powder (MOVIPREP) 100 G SOLR Take 1 kit (100 g total) by mouth once.  1 kit  0  . trimethoprim-polymyxin b (POLYTRIM) ophthalmic solution Place 1 drop into the left eye 4 (four) times daily as needed.  10 mL  0  . cetirizine (ZYRTEC) 10 MG tablet Take 10 mg by mouth daily.      . hydrochlorothiazide (HYDRODIURIL) 25 MG tablet Take 25 mg by mouth daily.      . Multiple Vitamins-Minerals (ALIVE WOMENS 50+ PO) Take 1 tablet by mouth daily.        No results found for this or any previous visit (from the past 48 hour(s)). No results found.  ROS  Blood pressure 133/71, pulse 75, temperature 97.5 F (36.4 C), temperature source Oral, resp. rate 16, height 5' 6.5" (1.689 m), weight 276 lb (125.193 kg), last menstrual period 04/28/2012, SpO2 100.00%. Physical Exam   Constitutional:  Well-developed obese African female in NAD  HENT:  Mouth/Throat: Oropharynx is clear and moist.  Eyes: Conjunctivae are normal. No scleral icterus.  Neck: No thyromegaly present.  Cardiovascular: Normal rate, regular rhythm and normal heart sounds.   No murmur heard. Respiratory: Effort normal and breath sounds normal.  GI: Soft. She exhibits no distension and no mass. There is no tenderness.  Musculoskeletal: She exhibits no edema.  Lymphadenopathy:    She has no cervical adenopathy.  Neurological: She is alert.  Skin: Skin is warm and dry.     Assessment/Plan Average risk screening colonoscopy.  Brysen Shankman U 05/05/2012, 10:35 AM

## 2012-05-05 NOTE — Op Note (Signed)
COLONOSCOPY PROCEDURE REPORT  PATIENT:  Haley Mcgrath  MR#:  [id number] Birthdate:  Jun 27, 1960, 52 y.o., female Endoscopist:  Dr. Malissa Hippo, MD Referred By:  Dr. Milinda Antis, MD Procedure Date: 05/05/2012  Procedure:   Colonoscopy  Indications: Patient is 52 year old African female who is  undergoing average risk screening colonoscopy.  Informed Consent:  The procedure and risks were reviewed with the patient and informed consent was obtained.  Medications:  Demerol 50 mg IV Versed 8 mg IV  Description of procedure:  After a digital rectal exam was performed, that colonoscope was advanced from the anus through the rectum and colon to the area of the cecum, ileocecal valve and appendiceal orifice. The cecum was deeply intubated. These structures were well-seen and photographed for the record. From the level of the cecum and ileocecal valve, the scope was slowly and cautiously withdrawn. The mucosal surfaces were carefully surveyed utilizing scope tip to flexion to facilitate fold flattening as needed. The scope was pulled down into the rectum where a thorough exam including retroflexion was performed.  Findings:   Prep excellent. Few scattered diverticula at sigmoid colon along with one at hepatic flexure. Normal rectal mucosa and anal rectal junction.    Therapeutic/Diagnostic Maneuvers Performed:  None  Complications:  None  Cecal Withdrawal Time:  10 minutes  Impression:  Examination performed to cecum. Few small diverticula at sigmoid colon and one at hepatic flexure. No evidence of colonic polyps.  Recommendations:  Standard instructions given. Next screening exam in 10 years.  REHMAN,NAJEEB U  05/05/2012 11:13 AM  CC: Dr. Milinda Antis, MD & Dr. Bonnetta Barry ref. provider found

## 2012-05-09 ENCOUNTER — Encounter (HOSPITAL_COMMUNITY): Payer: Self-pay | Admitting: Internal Medicine

## 2012-06-15 NOTE — Telephone Encounter (Signed)
Called patient and left message for them to return call at the office   

## 2012-09-22 ENCOUNTER — Emergency Department (HOSPITAL_COMMUNITY): Payer: BC Managed Care – PPO

## 2012-09-22 ENCOUNTER — Encounter (HOSPITAL_COMMUNITY): Payer: Self-pay

## 2012-09-22 ENCOUNTER — Emergency Department (HOSPITAL_COMMUNITY)
Admission: EM | Admit: 2012-09-22 | Discharge: 2012-09-22 | Disposition: A | Discharge status: Home / Self Care | Payer: BC Managed Care – PPO | Attending: Emergency Medicine | Admitting: Emergency Medicine

## 2012-09-22 DIAGNOSIS — I10 Essential (primary) hypertension: Secondary | ICD-10-CM | POA: Insufficient documentation

## 2012-09-22 DIAGNOSIS — Z8739 Personal history of other diseases of the musculoskeletal system and connective tissue: Secondary | ICD-10-CM | POA: Insufficient documentation

## 2012-09-22 DIAGNOSIS — Z3202 Encounter for pregnancy test, result negative: Secondary | ICD-10-CM | POA: Insufficient documentation

## 2012-09-22 DIAGNOSIS — J45909 Unspecified asthma, uncomplicated: Secondary | ICD-10-CM | POA: Insufficient documentation

## 2012-09-22 DIAGNOSIS — E669 Obesity, unspecified: Secondary | ICD-10-CM | POA: Insufficient documentation

## 2012-09-22 DIAGNOSIS — Z79899 Other long term (current) drug therapy: Secondary | ICD-10-CM | POA: Insufficient documentation

## 2012-09-22 DIAGNOSIS — R109 Unspecified abdominal pain: Secondary | ICD-10-CM

## 2012-09-22 LAB — CBC WITH DIFFERENTIAL/PLATELET
Basophils Relative: 0 % (ref 0–1)
Eosinophils Absolute: 0.4 10*3/uL (ref 0.0–0.7)
Eosinophils Relative: 4 % (ref 0–5)
HCT: 38.5 % (ref 36.0–46.0)
Hemoglobin: 13.1 g/dL (ref 12.0–15.0)
Lymphs Abs: 3.1 10*3/uL (ref 0.7–4.0)
MCH: 30.7 pg (ref 26.0–34.0)
MCHC: 34 g/dL (ref 30.0–36.0)
MCV: 90.2 fL (ref 78.0–100.0)
Monocytes Absolute: 0.7 10*3/uL (ref 0.1–1.0)
Monocytes Relative: 6 % (ref 3–12)

## 2012-09-22 LAB — LIPASE, BLOOD: Lipase: 21 U/L (ref 11–59)

## 2012-09-22 LAB — URINALYSIS, ROUTINE W REFLEX MICROSCOPIC
Bilirubin Urine: NEGATIVE
Glucose, UA: NEGATIVE mg/dL
Ketones, ur: NEGATIVE mg/dL
Leukocytes, UA: NEGATIVE
Protein, ur: NEGATIVE mg/dL

## 2012-09-22 LAB — URINE MICROSCOPIC-ADD ON

## 2012-09-22 LAB — COMPREHENSIVE METABOLIC PANEL
Albumin: 3.9 g/dL (ref 3.5–5.2)
BUN: 18 mg/dL (ref 6–23)
Creatinine, Ser: 1.06 mg/dL (ref 0.50–1.10)
GFR calc Af Amer: 69 mL/min — ABNORMAL LOW (ref >90)
Glucose, Bld: 108 mg/dL — ABNORMAL HIGH (ref 70–99)
Total Protein: 7.5 g/dL (ref 6.0–8.3)

## 2012-09-22 MED ORDER — HYDROCODONE-ACETAMINOPHEN 5-325 MG PO TABS: 1.0000 | ORAL_TABLET | Freq: Four times a day (QID) | ORAL | Status: DC | PRN

## 2012-09-22 MED ORDER — SODIUM CHLORIDE 0.9 % IV SOLN
1000.0000 mL | Freq: Once | INTRAVENOUS | Status: AC
  Administered 2012-09-22: 1000 mL via INTRAVENOUS

## 2012-09-22 MED ORDER — SODIUM CHLORIDE 0.9 % IV SOLN
1000.0000 mL | INTRAVENOUS | Status: DC
  Administered 2012-09-22: 1000 mL via INTRAVENOUS

## 2012-09-22 MED ORDER — HYDROMORPHONE HCL PF 1 MG/ML IJ SOLN
0.5000 mg | INTRAMUSCULAR | Status: DC | PRN
  Administered 2012-09-22: 0.5 mg via INTRAVENOUS
  Filled 2012-09-22: qty 1

## 2012-09-22 MED ORDER — ONDANSETRON HCL 4 MG/2ML IJ SOLN
4.0000 mg | Freq: Once | INTRAMUSCULAR | Status: AC
  Administered 2012-09-22: 4 mg via INTRAVENOUS
  Filled 2012-09-22: qty 2

## 2012-09-22 MED ORDER — ONDANSETRON 8 MG PO TBDP: 8.0000 mg | ORAL_TABLET | Freq: Three times a day (TID) | ORAL | Status: DC | PRN

## 2012-09-22 NOTE — ED Notes (Signed)
Pt complain of severe abdominal cramping/spasms since early this morning. Denies other symptoms

## 2012-09-22 NOTE — ED Provider Notes (Signed)
CSN: [id number]     Arrival date & time 09/22/12  1212 History   First MD Initiated Contact with Patient 09/22/12 1228     Chief Complaint  Patient presents with  . Abdominal Cramping    Patient is a 52 y.o. female presenting with cramps.  Abdominal Cramping  She had a little bit of pain in her sides before work last night but those symptoms resolved and she felt fine at work. Afterwork she went to get something to eat and The pain started after eating a chicken biscuit and hash browns this morning.  The pain has increased in intensity since that began.  The pain is cramping and spasming.  It is mostly around the belly button.  No radiation.  No vomiting.  NO diarrhea.   NO fever or urinary trouble.  No vaginal discharge or bleeding.   She does have any history of any gallbladder problems. Her only prior abdominal surgery was a tubal ligation to Past Medical History  Diagnosis Date  . Arthritis     bilateral in hands  . Hypertension   . Asthma     history of childhood asthma   Past Surgical History  Procedure Laterality Date  . Tubal ligation    . Colonoscopy N/A 05/05/2012    Procedure: COLONOSCOPY;  Surgeon: Malissa Hippo, MD;  Location: [facility];  Service: Endoscopy;  Laterality: N/A;  1030   Family History  Problem Relation Age of Onset  . Heart disease Father   . Hypertension Father   . Colon cancer Neg Hx    History  Substance Use Topics  . Smoking status: Never Smoker   . Smokeless tobacco: Never Used  . Alcohol Use: No   OB History   Grav Para Term Preterm Abortions TAB SAB Ect Mult Living                 Review of Systems  All other systems reviewed and are negative.    Allergies  Sulfonamide derivatives  Home Medications   Current Outpatient Rx  Name  Route  Sig  Dispense  Refill  . cetirizine (ZYRTEC) 10 MG tablet   Oral   Take 10 mg by mouth daily.         . hydrochlorothiazide (HYDRODIURIL) 25 MG tablet   Oral   Take 25 mg by mouth  daily.         . Multiple Vitamins-Minerals (ALIVE WOMENS 50+ PO)   Oral   Take 1 tablet by mouth daily.         . fluticasone (FLONASE) 50 MCG/ACT nasal spray   Nasal   Place 1 spray into the nose daily.   16 g   2   . HYDROcodone-acetaminophen (NORCO) 5-325 MG per tablet   Oral   Take 1 tablet by mouth every 6 (six) hours as needed for pain.   12 tablet   0   . ibuprofen (ADVIL,MOTRIN) 200 MG tablet   Oral   Take 200 mg by mouth every 6 (six) hours as needed for pain.         Marland Kitchen ondansetron (ZOFRAN ODT) 8 MG disintegrating tablet   Oral   Take 1 tablet (8 mg total) by mouth every 8 (eight) hours as needed for nausea.   20 tablet   0    BP 111/75  Pulse 61  Temp(Src) 97.5 F (36.4 C) (Oral)  Resp 17  Ht 5\' 7"  (1.702 m)  Wt 275 lb (  124.739 kg)  BMI 43.06 kg/m2  SpO2 100%  LMP 09/02/2012 Physical Exam  Nursing note and vitals reviewed. Constitutional: She appears well-developed and well-nourished. No distress.  Obese   HENT:  Head: Normocephalic and atraumatic.  Right Ear: External ear normal.  Left Ear: External ear normal.  Eyes: Conjunctivae are normal. Right eye exhibits no discharge. Left eye exhibits no discharge. No scleral icterus.  Neck: Neck supple. No tracheal deviation present.  Cardiovascular: Normal rate, regular rhythm and intact distal pulses.   Pulmonary/Chest: Effort normal and breath sounds normal. No stridor. No respiratory distress. She has no wheezes. She has no rales.  Abdominal: Soft. Bowel sounds are normal. She exhibits no distension. There is tenderness in the right upper quadrant and epigastric area. There is no rebound and no guarding.  Musculoskeletal: She exhibits no edema and no tenderness.  Neurological: She is alert. She has normal strength. No sensory deficit. Cranial nerve deficit:  no gross defecits noted. She exhibits normal muscle tone. She displays no seizure activity. Coordination normal.  Skin: Skin is warm and dry.  No rash noted.  Psychiatric: She has a normal mood and affect.    ED Course   Procedures (including critical care time)  Labs Reviewed  URINALYSIS, ROUTINE W REFLEX MICROSCOPIC - Abnormal; Notable for the following:    Hgb urine dipstick TRACE (*)    All other components within normal limits  COMPREHENSIVE METABOLIC PANEL - Abnormal; Notable for the following:    Potassium 3.2 (*)    Glucose, Bld 108 (*)    AST 43 (*)    ALT 39 (*)    GFR calc non Af Amer 60 (*)    GFR calc Af Amer 69 (*)    All other components within normal limits  URINE MICROSCOPIC-ADD ON - Abnormal; Notable for the following:    Squamous Epithelial / LPF MANY (*)    Bacteria, UA FEW (*)    All other components within normal limits  PREGNANCY, URINE  CBC WITH DIFFERENTIAL  LIPASE, BLOOD   US Abdomen Complete  09/22/2012   *RADIOLOGY REPORT*  Abdominal ultrasound  History:  Abdominal cramping  Comparison:  None  Findings:  Gallbladder is visualized in multiple projections. There are no gallstones, gallbladder wall thickening, or pericholecystic fluid collection.  There is no intrahepatic, common hepatic, common bile duct dilatation.  Portions of pancreas were obscured by gas.  The visualized portions of the pancreas appear normal.  Liver is enlarged measuring 19.7 cm in length.  The liver shows increased echogenicity consistent with a degree of fatty change. No focal liver lesions are identified. Flow in the portal vein is in the anatomic direction.  Spleen is normal in size and homogeneous echotexture.  Kidneys bilaterally appear within normal limits. There is no ascites. Aorta is nonaneurysmal.  Inferior vena cava is patent.  Conclusion:  Liver is enlarged and shows a degree of fatty change. While no focal liver lesions are identified, it must be cautioned that the sensitivity of ultrasound for more subtle liver lesions is diminished given underlying fatty change.  Portions of pancreas are obscured by gas.   Visualized portions of pancreas appear normal.  Study otherwise unremarkable.   Original Report Authenticated By: Bretta Bang, M.D.   1. Abdominal pain     MDM  The patient improved with treatment in the emergency department. Her laboratory tests were reassuring. Her ultrasound does not show evidence of cholecystitis or cholelithiasis.    Discussed the findings with the  patient. No discharge her home with medications for pain and nausea. She understands return emergent for worsening symptoms fever or vomiting.  At this time there does not appear to be any evidence of an acute emergency medical condition and the patient appears stable for discharge with appropriate outpatient follow up.    Celene Kras, MD 09/22/12 [phone]

## 2012-09-22 NOTE — ED Notes (Signed)
MD at bedside. 

## 2012-09-28 ENCOUNTER — Ambulatory Visit (INDEPENDENT_AMBULATORY_CARE_PROVIDER_SITE_OTHER): Payer: BC Managed Care – PPO | Admitting: Family Medicine

## 2012-09-28 ENCOUNTER — Other Ambulatory Visit: Payer: Self-pay | Admitting: Family Medicine

## 2012-09-28 VITALS — BP 128/82 | HR 82 | Temp 97.4°F | Resp 18 | Wt 275.0 lb

## 2012-09-28 DIAGNOSIS — K760 Fatty (change of) liver, not elsewhere classified: Secondary | ICD-10-CM

## 2012-09-28 DIAGNOSIS — K7689 Other specified diseases of liver: Secondary | ICD-10-CM

## 2012-09-28 DIAGNOSIS — R1013 Epigastric pain: Secondary | ICD-10-CM

## 2012-09-28 LAB — COMPREHENSIVE METABOLIC PANEL
ALT: 35 U/L (ref 0–35)
AST: 41 U/L — ABNORMAL HIGH (ref 0–37)
BUN: 19 mg/dL (ref 6–23)
CO2: 29 mEq/L (ref 19–32)
Calcium: 9.9 mg/dL (ref 8.4–10.5)
Chloride: 100 mEq/L (ref 96–112)
Creat: 1.14 mg/dL — ABNORMAL HIGH (ref 0.50–1.10)
Total Bilirubin: 0.4 mg/dL (ref 0.3–1.2)

## 2012-09-28 LAB — CBC WITH DIFFERENTIAL/PLATELET
Eosinophils Relative: 5 % (ref 0–5)
HCT: 37.7 % (ref 36.0–46.0)
Hemoglobin: 12.7 g/dL (ref 12.0–15.0)
Lymphocytes Relative: 34 % (ref 12–46)
MCV: 90 fL (ref 78.0–100.0)
Monocytes Absolute: 0.8 10*3/uL (ref 0.1–1.0)
Monocytes Relative: 9 % (ref 3–12)
Neutro Abs: 4.2 10*3/uL (ref 1.7–7.7)
RDW: 15.2 % (ref 11.5–15.5)
WBC: 8.1 10*3/uL (ref 4.0–10.5)

## 2012-09-28 NOTE — Patient Instructions (Signed)
Start the nexium once a day before dinner I will recheck your liver labs today If not improved please call and you will be referred to Dr. Karilyn Cota- GI

## 2012-09-29 ENCOUNTER — Encounter: Payer: Self-pay | Admitting: Family Medicine

## 2012-09-29 DIAGNOSIS — K760 Fatty (change of) liver, not elsewhere classified: Secondary | ICD-10-CM | POA: Insufficient documentation

## 2012-09-29 NOTE — Assessment & Plan Note (Addendum)
Query gastritis or GERD with the epigastric spasms, I will give her a trial of nexium RUQ ultrasound showed fatty liver otherwise normal, no evidence of cholecystitis, no evidence of pancreatitis LFT to be repeated today If no improvement refer to GI Will add H pylori to labs as well

## 2012-09-29 NOTE — Progress Notes (Signed)
  Subjective:    Patient ID: Haley Mcgrath, female    DOB: Feb 21, 1960, 52 y.o.   MRN: [id number]  HPI  Pt here with abdominal pain, seen in ED 1 week ago for spasms in her upper stomach. Occurred after eating a chicken biscuit. They persisted that evening so went to ED. Labs showed mild elevation in LFT, RUQ was ordered which showed fatty infiltration of liver only. She was given norco and discharged home, UA was negative. She was doing well and pain had eased up, only took 3 pain pills. After eating Sunday had another episode. Had episode of spasm and pain this AM, but was not associated with eating.  Lipase neg. Denies N/V, diarrhea, belching, CP  Review of Systems  GEN- denies fatigue, fever, weight loss,weakness, recent illness CVS- denies chest pain, palpitations RESP- denies SOB, cough, wheeze ABD- denies N/V, change in stools, +abd pain GU- denies dysuria, hematuria, dribbling, incontinence Neuro- denies headache, dizziness, syncope, seizure activity      Objective:   Physical Exam GEN- NAD, alert and oriented x3, obese HEENT- PERRL, EOMI, non injected sclera, pink conjunctiva, MMM, oropharynx clear CVS- RRR, no murmur RESP-CTAB ABD-NABS,soft, mild TTP Epigastric, RUQ region and belly button no rebound,no guarding, no mass felt, no CVA tenderness EXT- No edema Pulses- Radial 2+        Assessment & Plan:

## 2012-09-29 NOTE — Assessment & Plan Note (Signed)
We will recheck FLP at upcoming visit Recheck LFT today Will hold on statin today during acute illness

## 2012-09-30 LAB — HELICOBACTER PYLORI  ANTIBODY, IGM: Helicobacter pylori, IgM: 1.7 U/mL (ref <9.0)

## 2012-10-17 ENCOUNTER — Ambulatory Visit (INDEPENDENT_AMBULATORY_CARE_PROVIDER_SITE_OTHER): Payer: BC Managed Care – PPO | Admitting: Family Medicine

## 2012-10-17 VITALS — BP 110/70 | HR 58 | Temp 97.6°F | Resp 16 | Wt 276.0 lb

## 2012-10-17 DIAGNOSIS — R21 Rash and other nonspecific skin eruption: Secondary | ICD-10-CM

## 2012-10-17 DIAGNOSIS — R1013 Epigastric pain: Secondary | ICD-10-CM

## 2012-10-17 MED ORDER — ESOMEPRAZOLE MAGNESIUM 20 MG PO CPDR: 20.0000 mg | DELAYED_RELEASE_CAPSULE | Freq: Every day | ORAL | Status: DC

## 2012-10-17 MED ORDER — METHYLPREDNISOLONE ACETATE 40 MG/ML IJ SUSP
40.0000 mg | Freq: Once | INTRAMUSCULAR | Status: AC
  Administered 2012-10-17: 40 mg via INTRAMUSCULAR

## 2012-10-17 MED ORDER — HYDROXYZINE PAMOATE 25 MG PO CAPS: 25.0000 mg | ORAL_CAPSULE | Freq: Three times a day (TID) | ORAL | Status: DC | PRN

## 2012-10-17 MED ORDER — HYDROCHLOROTHIAZIDE 25 MG PO TABS: 25.0000 mg | ORAL_TABLET | Freq: Every day | ORAL | Status: DC

## 2012-10-17 MED ORDER — PREDNISONE 10 MG PO TABS: ORAL_TABLET | ORAL | Status: DC

## 2012-10-17 NOTE — Patient Instructions (Addendum)
Start the prednisone tomorrow Use the vistaril as needed for itching Call for any signs of infection or any changes in rash

## 2012-10-18 NOTE — Assessment & Plan Note (Signed)
Improved, continue nexium

## 2012-10-18 NOTE — Assessment & Plan Note (Signed)
Appears to be some type of allergic reaction. I do not see any signs of cellulitis today. She does have some open areas where she has scratched it she is at 58 monitor for signs of infection. We'll start her on Depo-Medrol IM today and then she will do a prednisone taper. She'll be given Vistaril for the itching. Return if not improved.

## 2012-10-18 NOTE — Progress Notes (Signed)
  Subjective:    Patient ID: Haley Mcgrath, female    DOB: [birth year], 52 y.o.   MRN: [id number]  HPI Patient here with rash on bilateral thighs arms and back for the past week. She went to the beach with some friends was in a hot tub but did not notice any bites or rash at the time. Over the past week she noticed a very itchy rash on her right thigh as well as other areas of her body. She denies any change in soap or lotion. She denies any fever associated. She's not had any drainage from the rash. No sick contacts.  She did apply triple antibiotic ointment to the rash. Her abdominal pain is much improved with nexium, would like script to continue  Review of Systems - per above  GEN- denies fatigue, fever, weight loss,weakness, recent illness HEENT- denies eye drainage, change in vision, nasal discharge, CVS- denies chest pain, palpitations RESP- denies SOB, cough, wheeze MSK- denies joint pain, muscle aches, injury Skin-+rash        Objective:   Physical Exam GEN- NAD, alert and oriented x3 HEENT- no oral lesions, MMM Skin- mildly erythematous large fine maculopapular rash on Right lateral-posterior thigh, few lesions scattered on back and left arm near axilla. Small quarter size patch on left thigh Pulses- Radial, DP- 2+        Assessment & Plan:

## 2012-12-28 ENCOUNTER — Telehealth: Payer: Self-pay | Admitting: Family Medicine

## 2012-12-28 MED ORDER — IBUPROFEN 800 MG PO TABS: 800.0000 mg | ORAL_TABLET | Freq: Three times a day (TID) | ORAL | Status: DC | PRN

## 2012-12-28 NOTE — Telephone Encounter (Signed)
Ibuprofen sent

## 2012-12-28 NOTE — Telephone Encounter (Signed)
Pt is calling today because she has been taking some idoprohen for her crick her neck and she is wanting to know if she could have something called in Call back number is [phone] Pharmacy [facility]

## 2012-12-28 NOTE — Telephone Encounter (Signed)
Called pt and is wanting to know if you can prescribe her 800mg  of ibuprofen for her neck she has been taking 200mg  and it is not touching the crick in her neck.

## 2013-02-15 ENCOUNTER — Ambulatory Visit (INDEPENDENT_AMBULATORY_CARE_PROVIDER_SITE_OTHER): Payer: BC Managed Care – PPO | Admitting: Family Medicine

## 2013-02-15 ENCOUNTER — Encounter: Payer: Self-pay | Admitting: Family Medicine

## 2013-02-15 VITALS — BP 118/70 | HR 90 | Temp 97.5°F | Resp 18 | Ht 65.0 in | Wt 286.0 lb

## 2013-02-15 DIAGNOSIS — B373 Candidiasis of vulva and vagina: Secondary | ICD-10-CM | POA: Insufficient documentation

## 2013-02-15 DIAGNOSIS — N760 Acute vaginitis: Secondary | ICD-10-CM

## 2013-02-15 DIAGNOSIS — B3731 Acute candidiasis of vulva and vagina: Secondary | ICD-10-CM

## 2013-02-15 DIAGNOSIS — N889 Noninflammatory disorder of cervix uteri, unspecified: Secondary | ICD-10-CM

## 2013-02-15 LAB — WET PREP FOR TRICH, YEAST, CLUE: Trich, Wet Prep: NONE SEEN

## 2013-02-15 MED ORDER — FLUCONAZOLE 150 MG PO TABS: 150.0000 mg | ORAL_TABLET | Freq: Once | ORAL | Status: DC

## 2013-02-15 NOTE — Assessment & Plan Note (Signed)
We'll treat with Diflucan x2

## 2013-02-15 NOTE — Patient Instructions (Addendum)
F/U for Physical and PAP Smear - ASAP Take anti-fungal medication

## 2013-02-15 NOTE — Assessment & Plan Note (Signed)
After viewing her wet prep she has a significant amount of yeast. The plaque that I see may actually just be on the cervix. I will have her take the treatment and she will return for Pap smear and reevaluation if the lesion is still present after treatment for infection she will be sent to GYN for colposcopy

## 2013-02-15 NOTE — Progress Notes (Signed)
   Subjective:    Patient ID: Haley OmsBetty Y Mcgrath, female    DOB: 1960/04/22, 53 y.o.   MRN: [id number]  HPI  Pt here with vaginal itching and mild discharge for the past few days. She was recently on Augmentin and prednisone for sinus infection and symptoms started after the antibiotics. She denies any dysuria or abnormal vaginal bleeding. She denies any abdominal pain.  Review of Systems - per above  GEN- denies fatigue, fever, weight loss,weakness, recent illness ABD- denies N/V, change in stools, abd pain GU- denies dysuria, hematuria, dribbling, incontinence        Objective:   Physical Exam GEN- NAD, alert and oriented x 3 GU- normal external genitalia, vaginal mucosa pink and moist, cervix visualized 11-12 o clock position white circular plaque noted, no blood form os+ discharge, no CMT, no ovarian masses, uterus normal size        Assessment & Plan:

## 2013-02-24 ENCOUNTER — Other Ambulatory Visit: Payer: BC Managed Care – PPO | Admitting: Family Medicine

## 2013-03-10 ENCOUNTER — Ambulatory Visit (INDEPENDENT_AMBULATORY_CARE_PROVIDER_SITE_OTHER): Payer: BC Managed Care – PPO | Admitting: Family Medicine

## 2013-03-10 ENCOUNTER — Other Ambulatory Visit: Payer: Self-pay | Admitting: Family Medicine

## 2013-03-10 ENCOUNTER — Encounter: Payer: Self-pay | Admitting: Family Medicine

## 2013-03-10 VITALS — BP 122/80 | HR 78 | Temp 97.8°F | Resp 18 | Ht 67.0 in | Wt 287.0 lb

## 2013-03-10 DIAGNOSIS — Z13228 Encounter for screening for other metabolic disorders: Secondary | ICD-10-CM

## 2013-03-10 DIAGNOSIS — E785 Hyperlipidemia, unspecified: Secondary | ICD-10-CM

## 2013-03-10 DIAGNOSIS — Z124 Encounter for screening for malignant neoplasm of cervix: Secondary | ICD-10-CM

## 2013-03-10 DIAGNOSIS — Z0000 Encounter for general adult medical examination without abnormal findings: Secondary | ICD-10-CM

## 2013-03-10 DIAGNOSIS — Z1231 Encounter for screening mammogram for malignant neoplasm of breast: Secondary | ICD-10-CM

## 2013-03-10 DIAGNOSIS — Z130 Encounter for screening for diseases of the blood and blood-forming organs and certain disorders involving the immune mechanism: Secondary | ICD-10-CM

## 2013-03-10 DIAGNOSIS — Z1329 Encounter for screening for other suspected endocrine disorder: Secondary | ICD-10-CM

## 2013-03-10 DIAGNOSIS — L304 Erythema intertrigo: Secondary | ICD-10-CM

## 2013-03-10 DIAGNOSIS — Z1321 Encounter for screening for nutritional disorder: Secondary | ICD-10-CM

## 2013-03-10 DIAGNOSIS — L538 Other specified erythematous conditions: Secondary | ICD-10-CM

## 2013-03-10 DIAGNOSIS — N889 Noninflammatory disorder of cervix uteri, unspecified: Secondary | ICD-10-CM

## 2013-03-10 MED ORDER — NYSTATIN 100000 UNIT/GM EX POWD: CUTANEOUS | Status: DC

## 2013-03-10 NOTE — Progress Notes (Signed)
Patient ID: Haley OmsBetty Y Vick, female   DOB: 1960-06-04, 53 y.o.   MRN: [id number]   Subjective:    Patient ID: Haley Mcgrath, female    DOB: 1960-06-04, 53 y.o.   MRN: [id number]  Patient presents for CPE  she has no specific concerns today she is here for complete physical exam. Her last Pap smear was in 2013 which was normal. She was treated a few weeks ago for yeast infection at that time she was noted to have a plaque-like lesion near the 11 to 12:00 position on her cervix. She did have some mild spotting after our last exam but her last menstrual period was in November of 2014. She is due for mammogram but is up-to-date on her colonoscopy. Also due for fasting labs    Review Of Systems:  GEN- denies fatigue, fever, weight loss,weakness, recent illness HEENT- denies eye drainage, change in vision, nasal discharge, CVS- denies chest pain, palpitations RESP- denies SOB, cough, wheeze ABD- denies N/V, change in stools, abd pain GU- denies dysuria, hematuria, dribbling, incontinence MSK- denies joint pain, muscle aches, injury Neuro- denies headache, dizziness, syncope, seizure activity       Objective:    BP 122/80  Pulse 78  Temp(Src) 97.8 F (36.6 C) (Oral)  Resp 18  Ht 5\' 7"  (1.702 m)  Wt 287 lb (130.182 kg)  BMI 44.94 kg/m2  LMP 01/02/2013 GEN- NAD, alert and oriented x3, HEENT- PERRL, EOMI, non injected sclera, pink conjunctiva, MMM, oropharynx clear Neck- Supple, no thyromegaly Breast- normal symmetry, no nipple inversion,no nipple drainage, no nodules or lumps felt, lNodes- no axillary nodes CVS- RRR, no murmur RESP-CTAB ABS-NABS,soft,NT,ND GU- normal external genitalia, vaginal mucosa pink and moist, cervix visualized 11-12 o clock position white circular plaque noted, no blood form , no CMT, no ovarian masses, uterus normal size Rectum- normal tone, no external lesions, FOBT neg EXT- No edema Pulses- Radial, DP- 2+ Skin- hyperpigmented skin, shiny appearance and with  sweat, no discharge       Assessment & Plan:      Problem List Items Addressed This Visit   Hyperlipidemia     Recheck FLP    Relevant Orders      Lipid panel    Other Visit Diagnoses   Other screening mammogram    -  Primary    Relevant Orders       MM DIGITAL SCREENING BILATERAL    Screening for malignant neoplasm of the cervix        Relevant Orders       PAP, ThinPrep ASCUS Rflx HPV Rflx Type    Routine general medical examination at a health care facility     - CPE done, PAP Smear, Mammogram to be done, Fasting labs, Colonoscopy UTD    Relevant Orders       Comprehensive metabolic panel       CBC with Differential    Encounter for vitamin deficiency screening        Relevant Orders       Vitamin D, 25-hydroxy       Note: This dictation was prepared with Dragon dictation along with smaller phrase technology. Any transcriptional errors that result from this process are unintentional.

## 2013-03-10 NOTE — Assessment & Plan Note (Signed)
Recheck FLP

## 2013-03-10 NOTE — Assessment & Plan Note (Signed)
The plaque-like lesion is still noted on her cervix I was did her GYN to have colposcopy done her Pap smear is pending

## 2013-03-10 NOTE — Patient Instructions (Addendum)
Nystatin Powder Mammogram to be set up Referral to GYN for spot seen We will send a letter with labs if normal I recommend eye visit once a year I recommend dental visit every 6 months Goal is to  Exercise 30 minutes 5 days a week F/U 4 months

## 2013-03-13 LAB — PAP THINPREP ASCUS RFLX HPV RFLX TYPE

## 2013-03-15 LAB — HPV DNA, RELFEX TYPE 16/18: HPV DNA High Risk: NOT DETECTED

## 2013-03-15 LAB — CBC WITH DIFFERENTIAL/PLATELET
Basophils Absolute: 0 10*3/uL (ref 0.0–0.1)
Basophils Relative: 0 % (ref 0–1)
Eosinophils Absolute: 0.2 10*3/uL (ref 0.0–0.7)
Eosinophils Relative: 3 % (ref 0–5)
HCT: 39.9 % (ref 36.0–46.0)
Hemoglobin: 13.3 g/dL (ref 12.0–15.0)
Lymphocytes Relative: 42 % (ref 12–46)
Lymphs Abs: 2.8 10*3/uL (ref 0.7–4.0)
MCH: 30.4 pg (ref 26.0–34.0)
MCHC: 33.3 g/dL (ref 30.0–36.0)
MCV: 91.3 fL (ref 78.0–100.0)
Monocytes Absolute: 0.6 10*3/uL (ref 0.1–1.0)
Monocytes Relative: 9 % (ref 3–12)
NEUTROS PCT: 46 % (ref 43–77)
Neutro Abs: 3.1 10*3/uL (ref 1.7–7.7)
PLATELETS: 326 10*3/uL (ref 150–400)
RBC: 4.37 MIL/uL (ref 3.87–5.11)
RDW: 15.2 % (ref 11.5–15.5)
WBC: 6.7 10*3/uL (ref 4.0–10.5)

## 2013-03-16 ENCOUNTER — Ambulatory Visit (HOSPITAL_COMMUNITY)
Admission: RE | Admit: 2013-03-16 | Discharge: 2013-03-16 | Disposition: A | Discharge status: Home / Self Care | Payer: BC Managed Care – PPO | Source: Ambulatory Visit | Attending: Family Medicine | Admitting: Family Medicine

## 2013-03-16 DIAGNOSIS — Z1231 Encounter for screening mammogram for malignant neoplasm of breast: Secondary | ICD-10-CM | POA: Insufficient documentation

## 2013-03-16 LAB — COMPREHENSIVE METABOLIC PANEL
ALT: 38 U/L — ABNORMAL HIGH (ref 0–35)
AST: 51 U/L — ABNORMAL HIGH (ref 0–37)
Albumin: 4.3 g/dL (ref 3.5–5.2)
Alkaline Phosphatase: 74 U/L (ref 39–117)
BUN: 19 mg/dL (ref 6–23)
CO2: 29 meq/L (ref 19–32)
Calcium: 9.6 mg/dL (ref 8.4–10.5)
Chloride: 104 mEq/L (ref 96–112)
Creat: 1.03 mg/dL (ref 0.50–1.10)
GLUCOSE: 86 mg/dL (ref 70–99)
POTASSIUM: 3.7 meq/L (ref 3.5–5.3)
Sodium: 138 mEq/L (ref 135–145)
TOTAL PROTEIN: 6.6 g/dL (ref 6.0–8.3)
Total Bilirubin: 0.5 mg/dL (ref 0.2–1.2)

## 2013-03-16 LAB — LIPID PANEL
CHOL/HDL RATIO: 3.8 ratio
CHOLESTEROL: 204 mg/dL — AB (ref 0–200)
HDL: 54 mg/dL (ref >39)
LDL Cholesterol: 130 mg/dL — ABNORMAL HIGH (ref 0–99)
Triglycerides: 99 mg/dL (ref <150)
VLDL: 20 mg/dL (ref 0–40)

## 2013-03-16 LAB — VITAMIN D 25 HYDROXY (VIT D DEFICIENCY, FRACTURES): Vit D, 25-Hydroxy: 33 ng/mL (ref 30–89)

## 2013-03-17 ENCOUNTER — Other Ambulatory Visit: Payer: Self-pay | Admitting: Family Medicine

## 2013-03-17 DIAGNOSIS — R945 Abnormal results of liver function studies: Secondary | ICD-10-CM

## 2013-03-17 DIAGNOSIS — K760 Fatty (change of) liver, not elsewhere classified: Secondary | ICD-10-CM

## 2013-03-17 DIAGNOSIS — R160 Hepatomegaly, not elsewhere classified: Secondary | ICD-10-CM

## 2013-03-17 DIAGNOSIS — R7989 Other specified abnormal findings of blood chemistry: Secondary | ICD-10-CM

## 2013-03-17 MED ORDER — SIMVASTATIN 20 MG PO TABS: 20.0000 mg | ORAL_TABLET | Freq: Every day | ORAL | Status: DC

## 2013-03-17 NOTE — Addendum Note (Signed)
Addended by: Milinda AntisURHAM, Hosam Mcfetridge F on: 03/17/2013 01:55 PM   Modules accepted: Orders

## 2013-03-28 ENCOUNTER — Ambulatory Visit: Payer: BC Managed Care – PPO | Admitting: Obstetrics and Gynecology

## 2013-03-31 ENCOUNTER — Encounter: Payer: Self-pay | Admitting: *Deleted

## 2013-04-03 ENCOUNTER — Encounter: Payer: Self-pay | Admitting: Obstetrics and Gynecology

## 2013-04-03 ENCOUNTER — Ambulatory Visit (INDEPENDENT_AMBULATORY_CARE_PROVIDER_SITE_OTHER): Payer: BC Managed Care – PPO | Admitting: Obstetrics and Gynecology

## 2013-04-03 ENCOUNTER — Other Ambulatory Visit: Payer: Self-pay | Admitting: Obstetrics and Gynecology

## 2013-04-03 ENCOUNTER — Encounter (INDEPENDENT_AMBULATORY_CARE_PROVIDER_SITE_OTHER): Payer: Self-pay

## 2013-04-03 VITALS — BP 138/80 | Ht 67.0 in | Wt 284.6 lb

## 2013-04-03 DIAGNOSIS — R87619 Unspecified abnormal cytological findings in specimens from cervix uteri: Secondary | ICD-10-CM

## 2013-04-03 DIAGNOSIS — Z3202 Encounter for pregnancy test, result negative: Secondary | ICD-10-CM

## 2013-04-03 DIAGNOSIS — N880 Leukoplakia of cervix uteri: Secondary | ICD-10-CM

## 2013-04-03 DIAGNOSIS — L988 Other specified disorders of the skin and subcutaneous tissue: Secondary | ICD-10-CM

## 2013-04-03 DIAGNOSIS — N889 Noninflammatory disorder of cervix uteri, unspecified: Secondary | ICD-10-CM

## 2013-04-03 LAB — POCT URINE PREGNANCY: Preg Test, Ur: NEGATIVE

## 2013-04-03 NOTE — Addendum Note (Signed)
Addended by: Gaylyn RongEVANS, Kalista Laguardia A on: 04/03/2013 02:38 PM   Modules accepted: Orders

## 2013-04-03 NOTE — Progress Notes (Signed)
This chart was scribed by Leone PayorSonum Patel, Medical Scribe, for Dr. Christin BachJohn Kenneith Stief on 04/03/13 at 1:02 PM. This chart was reviewed by Dr. Christin BachJohn Amayiah Gosnell and is accurate.    [facility] Visit  Patient name: Haley Mcgrath MRN [id number]  Date of birth: 1960/03/27  CC & HPI:  Haley OmsBetty Y Firebaugh is a 53 y.o. female presenting today for a suspicious spot on the cervix that was noticed on last pap smear on 03/10/13 by Dr. Jeanice Limurham. Here for colposcopy and biopsy.   ROS:  Negative except noted above   Pertinent History Reviewed:  Medical & Surgical Hx:  Reviewed: Significant for   Past Medical History  Diagnosis Date  . Arthritis     bilateral in hands  . Hypertension   . Asthma     history of childhood asthma    Past Surgical History  Procedure Laterality Date  . Tubal ligation    . Colonoscopy N/A 05/05/2012    Procedure: COLONOSCOPY;  Surgeon: Malissa HippoNajeeb U Rehman, MD;  Location: [facility];  Service: Endoscopy;  Laterality: N/A;  1030    Medications: Reviewed & Updated - see associated section Social History: Reviewed -  reports that she has never smoked. She has never used smokeless tobacco.  Objective Findings:  Vitals: BP 138/80  Ht 5\' 7"  (1.702 m)  Wt 284 lb 9.6 oz (129.094 kg)  BMI 44.56 kg/m2  LMP 03/18/2013  Physical Examination: General appearance - alert, well appearing, and in no distress and oriented to person, place, and time Pelvic - VULVA: normal appearing vulva with no masses, tenderness or lesions, VAGINA: normal appearing vagina with normal color and discharge, no lesions, Vaginal tissue are atrophic  CERVIX: Nabothian cyst at 6 o'clock, Anterior lip of cervix at 12 o'clock has area of thickened, white patch, probably cervical dysplasia.  UTERUS: uterus is normal size, shape, consistency and nontender,  ADNEXA: normal adnexa in size, nontender and no masses    GYNECOLOGY CLINIC COLPOSCOPY PROCEDURE NOTE  53 y.o. No obstetric history on file. here for colposcopy for  EPITHELIAL CELL ABNORMALITY: SQUAMOUS CELLS, ATYPICAL SQUAMOUS CELLS OF UNDETERMINED SIGNIFICANCE (ASC-US). Pap smear done on 03/10/13. Discussed role for HPV in cervical dysplasia, need for surveillance.  Patient given informed consent, signed copy in the chart, time out was performed.  Placed in lithotomy position. Cervix viewed with speculum and colposcope after application of acetic acid.   Colposcopy adequate? Yes Anterior lip of cervix 1 cm area of thickened, white hyperkeratosis. Biopsies obtained.  ECC specimen not obtained. All specimens were labelled and sent to pathology.  Patient was given post procedure instructions.  Will follow up pathology and manage accordingly.  Routine preventative health maintenance measures emphasized.     Assessment & Plan:   A: 1. Cervical dysplasia vs hyperkeratosis  P: 1. Colposcopy donne 2. Follow up on tissue report

## 2013-04-04 ENCOUNTER — Telehealth: Payer: Self-pay | Admitting: Obstetrics and Gynecology

## 2013-04-04 NOTE — Telephone Encounter (Signed)
noted 

## 2013-04-04 NOTE — Telephone Encounter (Signed)
Hyperkeratosis, a normal variation, on biopsy of cervix. Pt aware. Will need pap in 1 yr.

## 2013-04-25 ENCOUNTER — Encounter (INDEPENDENT_AMBULATORY_CARE_PROVIDER_SITE_OTHER): Payer: Self-pay | Admitting: *Deleted

## 2013-05-01 ENCOUNTER — Ambulatory Visit (INDEPENDENT_AMBULATORY_CARE_PROVIDER_SITE_OTHER): Payer: BC Managed Care – PPO | Admitting: Internal Medicine

## 2013-05-01 ENCOUNTER — Encounter (INDEPENDENT_AMBULATORY_CARE_PROVIDER_SITE_OTHER): Payer: Self-pay | Admitting: Internal Medicine

## 2013-05-01 VITALS — BP 122/84 | HR 72 | Temp 97.8°F | Ht 67.0 in | Wt 284.2 lb

## 2013-05-01 DIAGNOSIS — K7689 Other specified diseases of liver: Secondary | ICD-10-CM

## 2013-05-01 DIAGNOSIS — K760 Fatty (change of) liver, not elsewhere classified: Secondary | ICD-10-CM

## 2013-05-01 DIAGNOSIS — R748 Abnormal levels of other serum enzymes: Secondary | ICD-10-CM

## 2013-05-01 NOTE — Progress Notes (Addendum)
Subjective:     Patient ID: Haley Mcgrath, female   DOB: 1960-03-19, 53 y.o.   MRN: [id number]  HPIReferred to our office by Dr. Jeanice Limurham for elevated liver enzymes. Her AST and ALT have been elevated for about 2 yrs per her records. Just started Zocor so this does not acct for the slight bump.  No tattoos, No IV drug use. No prior hx of jaundice.  Appetite is good. No weight loss. No abdominal pain. BMs are normal. No constipation. No melena or bright red rectal bleeding. She does not exercise.  CMP     Component Value Date/Time   NA 138 03/15/2013 1159   K 3.7 03/15/2013 1159   CL 104 03/15/2013 1159   CO2 29 03/15/2013 1159   GLUCOSE 86 03/15/2013 1159   BUN 19 03/15/2013 1159   CREATININE 1.03 03/15/2013 1159   CREATININE 1.06 09/22/2012 1230   CALCIUM 9.6 03/15/2013 1159   PROT 6.6 03/15/2013 1159   ALBUMIN 4.3 03/15/2013 1159   AST 51* 03/15/2013 1159   ALT 38* 03/15/2013 1159   ALKPHOS 74 03/15/2013 1159   BILITOT 0.5 03/15/2013 1159   GFRNONAA 60* 09/22/2012 1230   GFRAA 69* 09/22/2012 1230   09/22/2012 US abdomen: Conclusion: Liver is enlarged and shows a degree of fatty change.  While no focal liver lesions are identified, it must be cautioned  that the sensitivity of ultrasound for more subtle liver lesions is  diminished given underlying fatty change.  Portions of pancreas are obscured by gas. Visualized portions of  pancreas appear normal.   She works at [employer] with [employer]. She has 3 children in good health. She is married.  Review of Systems Past Medical History  Diagnosis Date  . Arthritis     bilateral in hands  . Hypertension   . Asthma     history of childhood asthma  . High cholesterol     Past Surgical History  Procedure Laterality Date  . Tubal ligation    . Colonoscopy N/A 05/05/2012    Procedure: COLONOSCOPY;  Surgeon: Malissa HippoNajeeb U Rehman, MD;  Location: [facility];  Service: Endoscopy;  Laterality: N/A;  1030    Allergies  Allergen Reactions  .  Sulfonamide Derivatives Hives    Current Outpatient Prescriptions on File Prior to Visit  Medication Sig Dispense Refill  . cetirizine (ZYRTEC) 10 MG tablet Take 10 mg by mouth daily.      . hydrochlorothiazide (HYDRODIURIL) 25 MG tablet Take 1 tablet (25 mg total) by mouth daily.  90 tablet  2  . hydrOXYzine (VISTARIL) 25 MG capsule Take 1 capsule (25 mg total) by mouth 3 (three) times daily as needed for itching.  20 capsule  0  . ibuprofen (ADVIL,MOTRIN) 800 MG tablet Take 1 tablet (800 mg total) by mouth every 8 (eight) hours as needed.  30 tablet  1  . Multiple Vitamins-Minerals (ALIVE WOMENS 50+ PO) Take 1 tablet by mouth daily.      Marland Kitchen. nystatin (MYCOSTATIN/NYSTOP) 100000 UNIT/GM POWD Apply beneath breast TID prn  60 g  3  . simvastatin (ZOCOR) 20 MG tablet Take 1 tablet (20 mg total) by mouth at bedtime.  30 tablet  3  . fluticasone (FLONASE) 50 MCG/ACT nasal spray Place 1 spray into the nose daily.  16 g  2   No current facility-administered medications on file prior to visit.        Objective:   Physical Exam  Filed Vitals:   05/01/13 [id number]  BP: 122/84  Pulse: 72  Temp: 97.8 F (36.6 C)  Height: 5\' 7"  (1.702 m)  Weight: 284 lb 3.2 oz (128.912 kg)   Alert and oriented. Skin warm and dry. Oral mucosa is moist.   . Sclera anicteric, conjunctivae is pink. Thyroid not enlarged. No cervical lymphadenopathy. Lungs clear. Heart regular rate and rhythm.  Abdomen is soft. Bowel sounds are positive. No hepatomegaly. No abdominal masses felt. No tenderness.  No edema to lower extremities.  Obese.     Assessment:     Elevated liver enzymes, fatty liver. No prior hx of IV drug use or tattoos. Probable NAFLD. Liver enzymes are slightly elevated.  Patient is morbidly obese.     Plan:    Hep B surfiace antigen, Hep C. Repeat  Hepatic function today, [place] RUQ Try to lose 10 pounds before next office visit.  OV in 6 months

## 2013-05-01 NOTE — Patient Instructions (Signed)
OV in 6 mons with an Hepatic function

## 2013-05-02 ENCOUNTER — Telehealth (INDEPENDENT_AMBULATORY_CARE_PROVIDER_SITE_OTHER): Payer: Self-pay | Admitting: Internal Medicine

## 2013-05-02 DIAGNOSIS — R748 Abnormal levels of other serum enzymes: Secondary | ICD-10-CM

## 2013-05-02 LAB — HEPATIC FUNCTION PANEL
ALBUMIN: 4.3 g/dL (ref 3.5–5.2)
ALT: 59 U/L — ABNORMAL HIGH (ref 0–35)
AST: 65 U/L — ABNORMAL HIGH (ref 0–37)
Alkaline Phosphatase: 105 U/L (ref 39–117)
BILIRUBIN TOTAL: 0.6 mg/dL (ref 0.2–1.2)
Bilirubin, Direct: 0.1 mg/dL (ref 0.0–0.3)
Indirect Bilirubin: 0.5 mg/dL (ref 0.2–1.2)
Total Protein: 7 g/dL (ref 6.0–8.3)

## 2013-05-02 LAB — HEPATITIS C ANTIBODY: HCV AB: NEGATIVE

## 2013-05-02 LAB — HEPATITIS B SURFACE ANTIGEN: Hepatitis B Surface Ag: NEGATIVE

## 2013-05-02 NOTE — Telephone Encounter (Signed)
Am going to rule out auto immune liver disease.

## 2013-05-02 NOTE — Telephone Encounter (Signed)
Sed rate added

## 2013-05-02 NOTE — Addendum Note (Signed)
Addended by: Len BlalockSETZER, Abree Romick L on: 05/02/2013 03:57 PM   Modules accepted: Orders

## 2013-05-02 NOTE — Telephone Encounter (Signed)
error 

## 2013-05-04 LAB — IRON: IRON: 56 ug/dL (ref 42–145)

## 2013-05-04 LAB — FERRITIN: Ferritin: 47 ng/mL (ref 10–291)

## 2013-05-04 LAB — SEDIMENTATION RATE: Sed Rate: 6 mm/hr (ref 0–22)

## 2013-05-05 LAB — ANA: ANA: NEGATIVE

## 2013-05-09 ENCOUNTER — Telehealth (INDEPENDENT_AMBULATORY_CARE_PROVIDER_SITE_OTHER): Payer: Self-pay | Admitting: *Deleted

## 2013-05-09 NOTE — Telephone Encounter (Signed)
I have left her a message. Not all of her labs are back.

## 2013-05-09 NOTE — Telephone Encounter (Signed)
Haley Mcgrath would like to get her lab results and Haley Mcgrath may leave a detailed message on her cell number at [phone].

## 2013-05-09 NOTE — Telephone Encounter (Signed)
Message has been left at home 

## 2013-05-10 ENCOUNTER — Ambulatory Visit (HOSPITAL_COMMUNITY)
Admission: RE | Admit: 2013-05-10 | Discharge: 2013-05-10 | Disposition: A | Discharge status: Home / Self Care | Payer: BC Managed Care – PPO | Source: Ambulatory Visit | Attending: Internal Medicine | Admitting: Internal Medicine

## 2013-05-10 ENCOUNTER — Telehealth (INDEPENDENT_AMBULATORY_CARE_PROVIDER_SITE_OTHER): Payer: Self-pay | Admitting: *Deleted

## 2013-05-10 DIAGNOSIS — K7689 Other specified diseases of liver: Secondary | ICD-10-CM | POA: Insufficient documentation

## 2013-05-10 DIAGNOSIS — R748 Abnormal levels of other serum enzymes: Secondary | ICD-10-CM

## 2013-05-10 DIAGNOSIS — K760 Fatty (change of) liver, not elsewhere classified: Secondary | ICD-10-CM

## 2013-05-10 DIAGNOSIS — R7989 Other specified abnormal findings of blood chemistry: Secondary | ICD-10-CM | POA: Insufficient documentation

## 2013-05-10 NOTE — Telephone Encounter (Signed)
.  Per Delrae Renderri Setzer,NP ,patient to have labs drawn prior to OV 11/09/13.

## 2013-05-19 ENCOUNTER — Telehealth (INDEPENDENT_AMBULATORY_CARE_PROVIDER_SITE_OTHER): Payer: Self-pay | Admitting: Internal Medicine

## 2013-05-19 DIAGNOSIS — K760 Fatty (change of) liver, not elsewhere classified: Secondary | ICD-10-CM

## 2013-05-19 NOTE — Telephone Encounter (Signed)
Alpha 1 antitrypsin and SMA ordered again.

## 2013-05-22 ENCOUNTER — Telehealth (INDEPENDENT_AMBULATORY_CARE_PROVIDER_SITE_OTHER): Payer: Self-pay | Admitting: Internal Medicine

## 2013-05-22 ENCOUNTER — Other Ambulatory Visit (INDEPENDENT_AMBULATORY_CARE_PROVIDER_SITE_OTHER): Payer: Self-pay | Admitting: Internal Medicine

## 2013-05-22 DIAGNOSIS — R740 Nonspecific elevation of levels of transaminase and lactic acid dehydrogenase [LDH]: Principal | ICD-10-CM

## 2013-05-22 DIAGNOSIS — R748 Abnormal levels of other serum enzymes: Secondary | ICD-10-CM

## 2013-05-22 DIAGNOSIS — R7401 Elevation of levels of liver transaminase levels: Secondary | ICD-10-CM

## 2013-05-22 NOTE — Telephone Encounter (Signed)
.  Per Terri Setzer,NP 

## 2013-05-22 NOTE — Telephone Encounter (Signed)
Adding labs

## 2013-05-25 LAB — ANTI-SMOOTH MUSCLE ANTIBODY, IGG: Smooth Muscle Ab: 14 U (ref <20)

## 2013-05-25 LAB — MITOCHONDRIAL ANTIBODIES: MITOCHONDRIAL M2 AB, IGG: 0.18 (ref <0.91)

## 2013-05-29 NOTE — Telephone Encounter (Signed)
Patient probably has fatty liver

## 2013-08-16 ENCOUNTER — Other Ambulatory Visit: Payer: Self-pay | Admitting: Family Medicine

## 2013-08-16 NOTE — Telephone Encounter (Signed)
Medication filled x1 with no refills.   Requires office visit before any further refills can be given.   Letter sent.  

## 2013-08-25 ENCOUNTER — Ambulatory Visit (INDEPENDENT_AMBULATORY_CARE_PROVIDER_SITE_OTHER): Payer: BC Managed Care – PPO | Admitting: Family Medicine

## 2013-08-25 ENCOUNTER — Encounter: Payer: Self-pay | Admitting: Family Medicine

## 2013-08-25 ENCOUNTER — Encounter: Payer: Self-pay | Admitting: *Deleted

## 2013-08-25 VITALS — BP 138/78 | HR 68 | Temp 97.4°F | Resp 14 | Ht 67.0 in | Wt 287.0 lb

## 2013-08-25 DIAGNOSIS — K760 Fatty (change of) liver, not elsewhere classified: Secondary | ICD-10-CM

## 2013-08-25 DIAGNOSIS — E785 Hyperlipidemia, unspecified: Secondary | ICD-10-CM

## 2013-08-25 DIAGNOSIS — I10 Essential (primary) hypertension: Secondary | ICD-10-CM

## 2013-08-25 DIAGNOSIS — K7689 Other specified diseases of liver: Secondary | ICD-10-CM

## 2013-08-25 LAB — CBC WITH DIFFERENTIAL/PLATELET
BASOS PCT: 0 % (ref 0–1)
Basophils Absolute: 0 10*3/uL (ref 0.0–0.1)
EOS ABS: 0.5 10*3/uL (ref 0.0–0.7)
Eosinophils Relative: 6 % — ABNORMAL HIGH (ref 0–5)
HCT: 36.7 % (ref 36.0–46.0)
Hemoglobin: 12.2 g/dL (ref 12.0–15.0)
Lymphocytes Relative: 36 % (ref 12–46)
Lymphs Abs: 2.7 10*3/uL (ref 0.7–4.0)
MCH: 30.1 pg (ref 26.0–34.0)
MCHC: 33.2 g/dL (ref 30.0–36.0)
MCV: 90.6 fL (ref 78.0–100.0)
MONOS PCT: 8 % (ref 3–12)
Monocytes Absolute: 0.6 10*3/uL (ref 0.1–1.0)
NEUTROS PCT: 50 % (ref 43–77)
Neutro Abs: 3.8 10*3/uL (ref 1.7–7.7)
PLATELETS: 267 10*3/uL (ref 150–400)
RBC: 4.05 MIL/uL (ref 3.87–5.11)
RDW: 15.4 % (ref 11.5–15.5)
WBC: 7.6 10*3/uL (ref 4.0–10.5)

## 2013-08-25 LAB — LIPID PANEL
CHOL/HDL RATIO: 2.9 ratio
Cholesterol: 178 mg/dL (ref 0–200)
HDL: 62 mg/dL (ref >39)
LDL Cholesterol: 99 mg/dL (ref 0–99)
TRIGLYCERIDES: 84 mg/dL (ref <150)
VLDL: 17 mg/dL (ref 0–40)

## 2013-08-25 LAB — COMPREHENSIVE METABOLIC PANEL
ALT: 35 U/L (ref 0–35)
AST: 38 U/L — ABNORMAL HIGH (ref 0–37)
Albumin: 4.2 g/dL (ref 3.5–5.2)
Alkaline Phosphatase: 69 U/L (ref 39–117)
BILIRUBIN TOTAL: 0.4 mg/dL (ref 0.2–1.2)
BUN: 18 mg/dL (ref 6–23)
CO2: 26 mEq/L (ref 19–32)
CREATININE: 1.07 mg/dL (ref 0.50–1.10)
Calcium: 9.2 mg/dL (ref 8.4–10.5)
Chloride: 105 mEq/L (ref 96–112)
GLUCOSE: 81 mg/dL (ref 70–99)
Potassium: 4.1 mEq/L (ref 3.5–5.3)
Sodium: 140 mEq/L (ref 135–145)
Total Protein: 6.8 g/dL (ref 6.0–8.3)

## 2013-08-25 MED ORDER — HYDROCHLOROTHIAZIDE 25 MG PO TABS: 25.0000 mg | ORAL_TABLET | Freq: Every day | ORAL | Status: DC

## 2013-08-25 MED ORDER — SIMVASTATIN 20 MG PO TABS: ORAL_TABLET | ORAL | Status: DC

## 2013-08-25 MED ORDER — IBUPROFEN 800 MG PO TABS: 800.0000 mg | ORAL_TABLET | Freq: Three times a day (TID) | ORAL | Status: DC | PRN

## 2013-08-25 NOTE — Progress Notes (Signed)
Patient ID: Haley Mcgrath, female   DOB: April 26, 1960, 53 y.o.   MRN: [id number]   Subjective:    Patient ID: Haley OmsBetty Y Mcgrath, female    DOB: April 26, 1960, 53 y.o.   MRN: [id number]  Patient presents for 4 month F/U  Patient here to follow chronic medical problems. She has no specific concerns today. She's history of hypertension as well as fatty liver and hyperlipidemia. She is due for repeat fasting labs. She has not lost any significant amount of weight since her last visit.   Review Of Systems:  GEN- denies fatigue, fever, weight loss,weakness, recent illness HEENT- denies eye drainage, change in vision, nasal discharge, CVS- denies chest pain, palpitations RESP- denies SOB, cough, wheeze ABD- denies N/V, change in stools, abd pain GU- denies dysuria, hematuria, dribbling, incontinence MSK- denies joint pain, muscle aches, injury Neuro- denies headache, dizziness, syncope, seizure activity       Objective:    BP 138/78  Pulse 68  Temp(Src) 97.4 F (36.3 C) (Oral)  Resp 14  Ht 5\' 7"  (1.702 m)  Wt 287 lb (130.182 kg)  BMI 44.94 kg/m2  LMP 07/17/2013 GEN- NAD, alert and oriented x3 HEENT- PERRL, EOMI, non injected sclera, pink conjunctiva, MMM, oropharynx clear CVS- RRR, no murmur RESP-CTAB EXT- trace edema Pulses- Radial 2+        Assessment & Plan:      Problem List Items Addressed This Visit   Hyperlipidemia   Relevant Medications      hydrochlorothiazide tablet      simvastatin (ZOCOR) tablet   Other Relevant Orders      Lipid panel (Completed)   Fatty liver   Essential hypertension, benign - Primary   Relevant Medications      hydrochlorothiazide tablet      simvastatin (ZOCOR) tablet   Other Relevant Orders      CBC with Differential (Completed)      Comprehensive metabolic panel (Completed)      Note: This dictation was prepared with Dragon dictation along with smaller phrase technology. Any transcriptional errors that result from this process are  unintentional.

## 2013-08-25 NOTE — Patient Instructions (Signed)
Work on diet and weight loss We will call with results Your PHYSICAL is  Mar 10, 2013 F/U 4 months

## 2013-08-27 NOTE — Assessment & Plan Note (Addendum)
On statin, discussed weight loss and dietary changes

## 2013-08-27 NOTE — Assessment & Plan Note (Signed)
Continue to work on dietary changes, activity level

## 2013-08-27 NOTE — Assessment & Plan Note (Signed)
Well controlled, check fasting labs 

## 2013-09-23 IMAGING — CR DG CHEST 2V
2 series · 2 of 2 positions shown · non-contrast
Comparison: 10/22/2011

CLINICAL DATA: Cough for 2 months

CHEST - 2 VIEW

[view not recorded (1 of 2)]
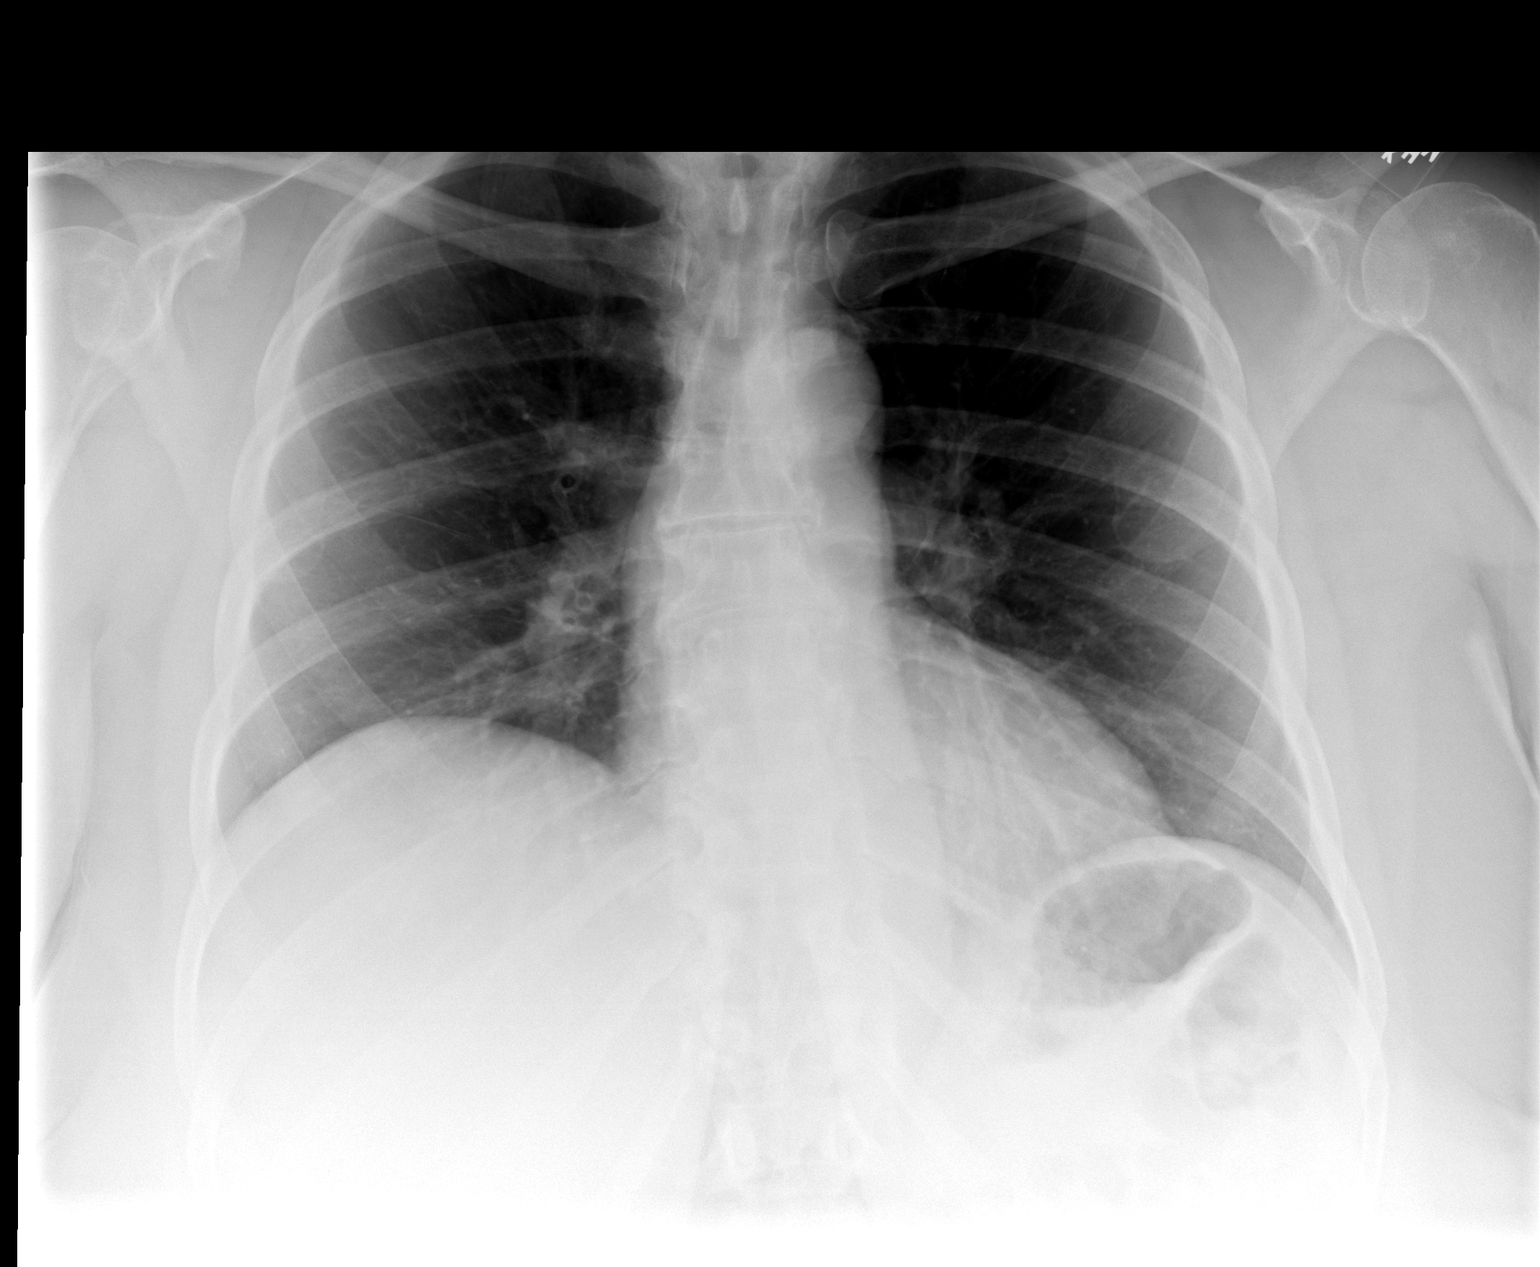

[view not recorded (2 of 2)]
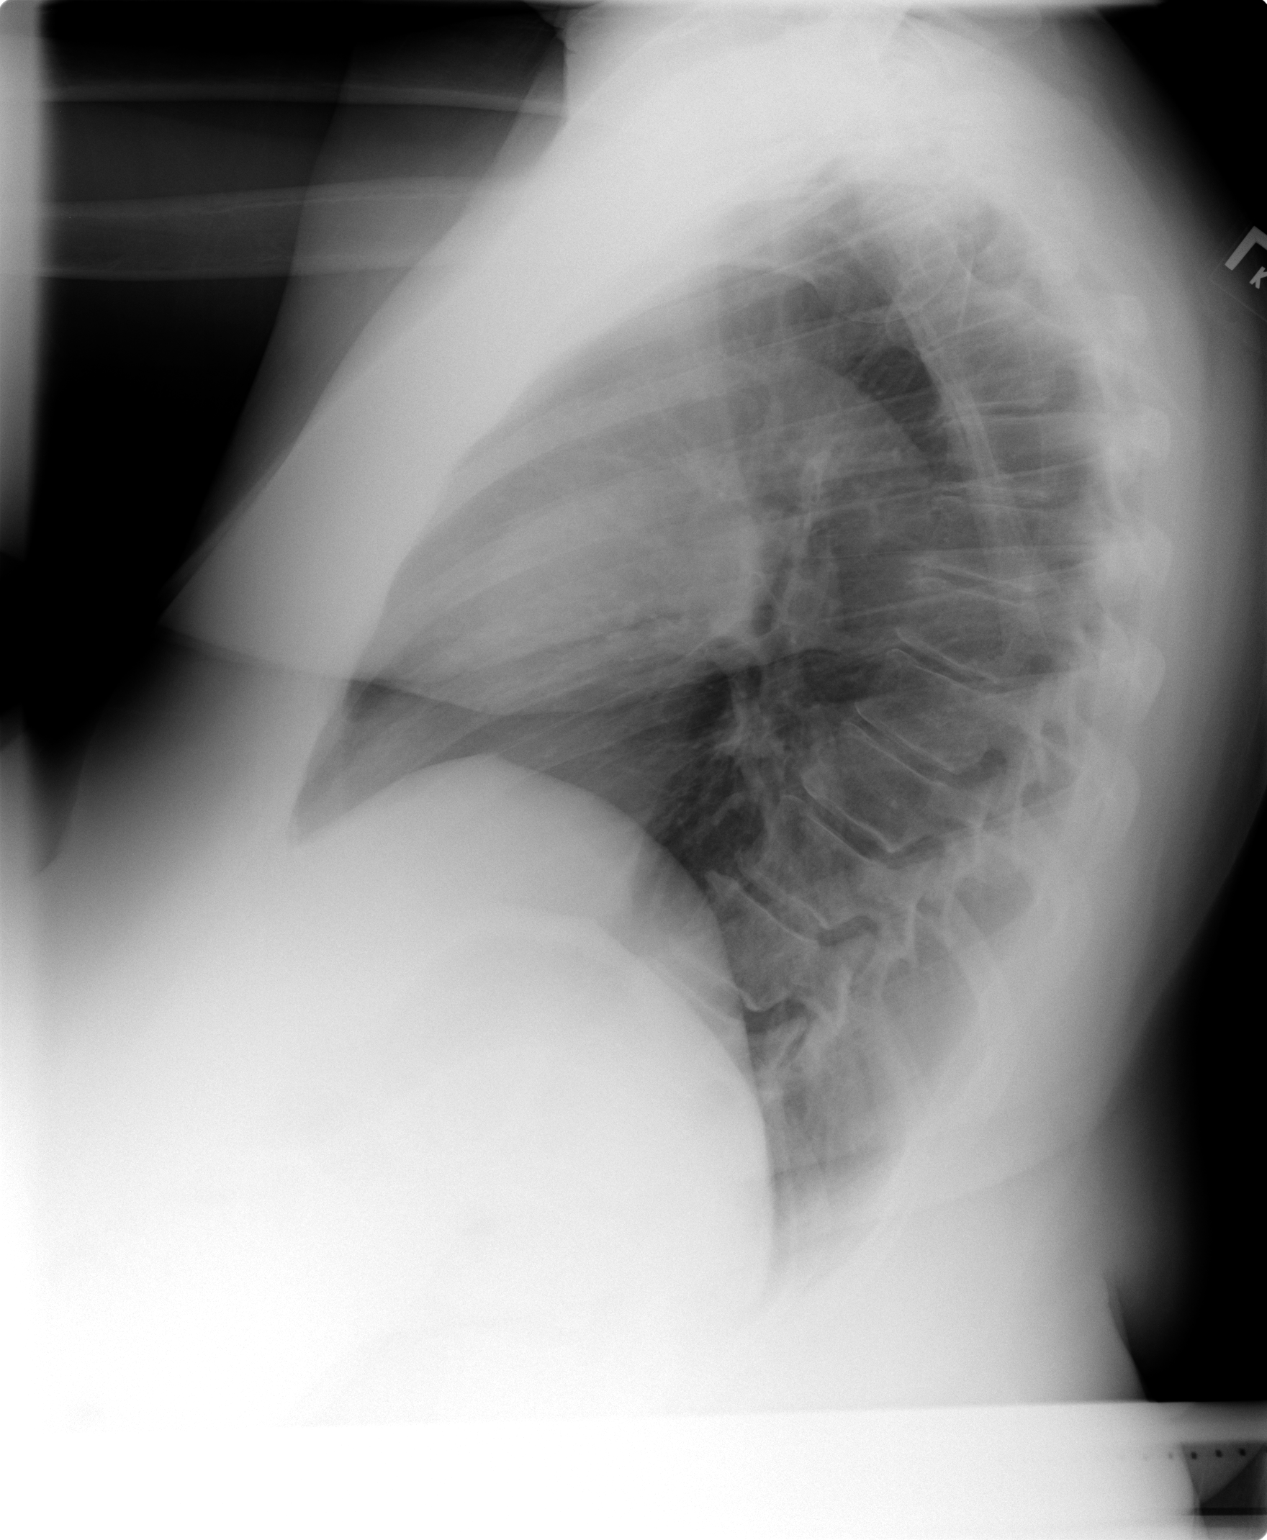

[2 of 2 positions shown; findings below may reference images not displayed]

FINDINGS: Heart and mediastinal contours are within normal limits.
The lung fields appear clear with no signs of focal infiltrate or
congestive failure.  No pleural fluid or significant peribronchial
cuffing is seen.

Bony structures appear intact.
IMPRESSION: Stable cardiopulmonary appearance with no new focal or acute
abnormality identified.

## 2013-10-03 ENCOUNTER — Encounter: Payer: Self-pay | Admitting: Family Medicine

## 2013-10-03 ENCOUNTER — Ambulatory Visit (INDEPENDENT_AMBULATORY_CARE_PROVIDER_SITE_OTHER): Payer: BC Managed Care – PPO | Admitting: Family Medicine

## 2013-10-03 VITALS — BP 136/78 | HR 68 | Temp 97.6°F | Resp 12 | Ht 65.0 in | Wt 281.0 lb

## 2013-10-03 DIAGNOSIS — IMO0002 Reserved for concepts with insufficient information to code with codable children: Secondary | ICD-10-CM

## 2013-10-03 DIAGNOSIS — M542 Cervicalgia: Secondary | ICD-10-CM

## 2013-10-03 DIAGNOSIS — S46911A Strain of unspecified muscle, fascia and tendon at shoulder and upper arm level, right arm, initial encounter: Secondary | ICD-10-CM

## 2013-10-03 MED ORDER — HYDROCODONE-ACETAMINOPHEN 5-325 MG PO TABS: 1.0000 | ORAL_TABLET | Freq: Four times a day (QID) | ORAL | Status: DC | PRN

## 2013-10-03 MED ORDER — METHYLPREDNISOLONE (PAK) 4 MG PO TABS: ORAL_TABLET | ORAL | Status: DC

## 2013-10-03 NOTE — Progress Notes (Signed)
Patient ID: Haley Mcgrath, female   DOB: 01-Mar-1960, 53 y.o.   MRN: [id number]   Subjective:    Patient ID: Haley Mcgrath, female    DOB: 03-14-1960, 53 y.o.   MRN: [id number]  Patient presents for R Arm Pain  patient here with right-sided neck and shoulder pain for the past 4 days. She states it feels similar to when she had a flare of her pain a few years ago. Bedtime. X-rays and imaging done of her neck she was told that she had some mild bulging disc in a pinched nerve. 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 She's been taking ibuprofen but this has not helped as much. She does get some tingling and sharp pains this is typically between the shoulder and her elbow she denies any tingling or numbness in her fingertips.    Review Of Systems:  GEN- denies fatigue, fever, weight loss,weakness, recent illness HEENT- denies eye drainage, change in vision, nasal discharge, CVS- denies chest pain, palpitations RESP- denies SOB, cough, wheeze MSK- + joint pain, muscle aches, injury Neuro- denies headache, dizziness, syncope, seizure activity       Objective:    BP 136/78  Pulse 68  Temp(Src) 97.6 F (36.4 C) (Oral)  Resp 12  Ht  (1.651 m)  Wt 281 lb (127.461 kg)  BMI 46.76 kg/m2 GEN- NAD, alert and oriented x3 HEENT- PERRL, EOMI, non injected sclera, pink conjunctiva, MMM, oropharynx clear Neck- Supple,fair ROM, mild TTP c spine, neg spurlings MSK- Bilat shoulders normal inspection, rotator cuff in tact, equivocal empty can right side, biceps in tact, mild TTP above medial olecranon and palpation of deltoid Neuro- strenth 4+/5 RUE compared to left, sensation in tact, normal tone EXT- No edema Pulses- Radial, DP- 2+        Assessment & Plan:      Problem List Items Addressed This Visit   None    Visit Diagnoses   Cervicalgia    -  Primary    medrol dose pak, no red flags, norco given, if no  improvement re-image    Shoulder strain, right, initial encounter        repetitie motions also has known neck pain and mild disc, contributing to pain       Note: This dictation was prepared with Dragon dictation along with smaller phrase technology. Any transcriptional errors that result from this process are unintentional.

## 2013-10-03 NOTE — Patient Instructions (Signed)
Note for Sunday 10/01/13 missed work can return on 10/04/13 Start the sterioddose pak Take the pain medication as prescribed No ibuprofen until you finish the dosepak F/u as previous

## 2013-10-25 ENCOUNTER — Encounter: Payer: Self-pay | Admitting: Family Medicine

## 2013-10-25 ENCOUNTER — Ambulatory Visit (INDEPENDENT_AMBULATORY_CARE_PROVIDER_SITE_OTHER): Payer: BC Managed Care – PPO | Admitting: Family Medicine

## 2013-10-25 ENCOUNTER — Encounter (INDEPENDENT_AMBULATORY_CARE_PROVIDER_SITE_OTHER): Payer: Self-pay | Admitting: *Deleted

## 2013-10-25 ENCOUNTER — Other Ambulatory Visit (INDEPENDENT_AMBULATORY_CARE_PROVIDER_SITE_OTHER): Payer: Self-pay | Admitting: *Deleted

## 2013-10-25 VITALS — BP 132/74 | HR 68 | Temp 97.4°F | Resp 14 | Ht 65.0 in | Wt 284.0 lb

## 2013-10-25 DIAGNOSIS — J069 Acute upper respiratory infection, unspecified: Secondary | ICD-10-CM

## 2013-10-25 DIAGNOSIS — K760 Fatty (change of) liver, not elsewhere classified: Secondary | ICD-10-CM

## 2013-10-25 DIAGNOSIS — R748 Abnormal levels of other serum enzymes: Secondary | ICD-10-CM

## 2013-10-25 MED ORDER — GUAIFENESIN-CODEINE 100-10 MG/5ML PO SOLN: ORAL | Status: DC

## 2013-10-25 NOTE — Progress Notes (Signed)
Patient ID: Haley Mcgrath, female   DOB: Dec 15, 1960, 53 y.o.   MRN: [id number]   Subjective:    Patient ID: Haley Mcgrath, female    DOB: 10/02/1960, 53 y.o.   MRN: [id number]  Patient presents for Non-Productive Cough  Patient here with dry cough for the past 5 or 6 days. She's also had some mild sinus drainage and some post nasal drip. She is using Flonase and Zyrtec. She's tried over-the-counter cough medicine as well as some home remedies without any improvement. No known sick contacts no fever no shortness of breath no chest pain. Cough keeping her up at night   Review Of Systems:  GEN- denies fatigue, fever, weight loss,weakness, recent illness HEENT- denies eye drainage, change in vision, nasal discharge, CVS- denies chest pain, palpitations RESP- denies SOB, +cough, wheeze ABD- denies N/V, change in stools, abd pain Neuro- denies headache, dizziness, syncope, seizure activity       Objective:    BP 132/74  Pulse 68  Temp(Src) 97.4 F (36.3 C) (Oral)  Resp 14  Ht  (1.651 m)  Wt 284 lb (128.822 kg)  BMI 47.26 kg/m2  LMP 09/18/2013 GEN- NAD, alert and oriented x3 HEENT- PERRL, EOMI, non injected sclera, pink conjunctiva, MMM, oropharynx clear, nares clear, no maxillary sinus tenderness Neck- Supple, no LAD CVS- RRR, no murmur RESP-CTAB Pulses- Radial 2+        Assessment & Plan:      Problem List Items Addressed This Visit   None    Visit Diagnoses   Acute URI    -  Primary    viral URI may have some fall allergies mixed in as well, continue allergy meds, add Robitussin with codiene unable to sleep due to cough, no antibiotics needed       Note: This dictation was prepared with Dragon dictation along with smaller phrase technology. Any transcriptional errors that result from this process are unintentional.

## 2013-10-25 NOTE — Patient Instructions (Signed)
Mucinex DM during the day  Take cough medicine at bedtime or during day if not working  F/U as needed

## 2013-10-30 ENCOUNTER — Telehealth: Payer: Self-pay | Admitting: Family Medicine

## 2013-10-30 NOTE — Telephone Encounter (Signed)
MD please advise

## 2013-10-30 NOTE — Telephone Encounter (Signed)
[facility]  Patient said she saw dr Jeanice Lim last week and she had told her that if she needed an antibiotic to call us and she thinks she needs an antibiotic  Please call her with questions to [phone]

## 2013-10-30 NOTE — Telephone Encounter (Signed)
Forward to [place]

## 2013-10-31 MED ORDER — AZITHROMYCIN 250 MG PO TABS: ORAL_TABLET | ORAL | Status: DC

## 2013-10-31 NOTE — Telephone Encounter (Signed)
Zpak called in

## 2013-10-31 NOTE — Telephone Encounter (Signed)
Call placed to patient and patient made aware.  

## 2013-11-09 ENCOUNTER — Ambulatory Visit (INDEPENDENT_AMBULATORY_CARE_PROVIDER_SITE_OTHER): Payer: BC Managed Care – PPO | Admitting: Internal Medicine

## 2013-11-21 ENCOUNTER — Telehealth (INDEPENDENT_AMBULATORY_CARE_PROVIDER_SITE_OTHER): Payer: Self-pay | Admitting: *Deleted

## 2013-11-21 ENCOUNTER — Encounter (INDEPENDENT_AMBULATORY_CARE_PROVIDER_SITE_OTHER): Payer: Self-pay | Admitting: *Deleted

## 2013-11-21 NOTE — Telephone Encounter (Signed)
Haley Mcgrath for her apt with Dorene Arerri Setzer, NP on 11/09/13. A NS letter has been mailed.

## 2013-12-06 ENCOUNTER — Ambulatory Visit (INDEPENDENT_AMBULATORY_CARE_PROVIDER_SITE_OTHER): Payer: BC Managed Care – PPO | Admitting: Internal Medicine

## 2013-12-06 ENCOUNTER — Encounter (INDEPENDENT_AMBULATORY_CARE_PROVIDER_SITE_OTHER): Payer: Self-pay | Admitting: Internal Medicine

## 2013-12-06 ENCOUNTER — Telehealth (INDEPENDENT_AMBULATORY_CARE_PROVIDER_SITE_OTHER): Payer: Self-pay | Admitting: *Deleted

## 2013-12-06 VITALS — BP 112/62 | HR 80 | Temp 97.6°F | Ht 67.0 in | Wt 280.4 lb

## 2013-12-06 DIAGNOSIS — K760 Fatty (change of) liver, not elsewhere classified: Secondary | ICD-10-CM

## 2013-12-06 DIAGNOSIS — R748 Abnormal levels of other serum enzymes: Secondary | ICD-10-CM

## 2013-12-06 DIAGNOSIS — R945 Abnormal results of liver function studies: Principal | ICD-10-CM

## 2013-12-06 DIAGNOSIS — R7989 Other specified abnormal findings of blood chemistry: Secondary | ICD-10-CM

## 2013-12-06 LAB — CBC
HEMATOCRIT: 39.8 % (ref 36.0–46.0)
HEMOGLOBIN: 13.4 g/dL (ref 12.0–15.0)
MCH: 30.4 pg (ref 26.0–34.0)
MCHC: 33.7 g/dL (ref 30.0–36.0)
MCV: 90.2 fL (ref 78.0–100.0)
Platelets: 317 10*3/uL (ref 150–400)
RBC: 4.41 MIL/uL (ref 3.87–5.11)
RDW: 15.2 % (ref 11.5–15.5)
WBC: 7.3 10*3/uL (ref 4.0–10.5)

## 2013-12-06 NOTE — Progress Notes (Signed)
Subjective:    Patient ID: Haley Mcgrath, female    DOB: 08/22/1960, 53 y.o.   MRN: [id number]  HPIFollow up for elevated transaminases/fatty liver. Last seen in March of this year. She tells me she feels okay. She says she has some slight cramping in her abdomen today. Appetite is good.  She has lost 4 pounds since her last visit. She is not exercising. Her BMs are moving normal. No melena or BRRB/.    Hepatic Function Panel     Component Value Date/Time   PROT 6.8 08/25/2013 0913   ALBUMIN 4.2 08/25/2013 0913   AST 38* 08/25/2013 0913   ALT 35 08/25/2013 0913   ALKPHOS 69 08/25/2013 0913   BILITOT 0.4 08/25/2013 0913   BILIDIR 0.1 05/01/2013 1016   IBILI 0.5 05/01/2013 1016      CMP     Component Value Date/Time   NA 140 08/25/2013 0913   K 4.1 08/25/2013 0913   CL 105 08/25/2013 0913   CO2 26 08/25/2013 0913   GLUCOSE 81 08/25/2013 0913   BUN 18 08/25/2013 0913   CREATININE 1.07 08/25/2013 0913   CREATININE 1.06 09/22/2012 1230   CALCIUM 9.2 08/25/2013 0913   PROT 6.8 08/25/2013 0913   ALBUMIN 4.2 08/25/2013 0913   AST 38* 08/25/2013 0913   ALT 35 08/25/2013 0913   ALKPHOS 69 08/25/2013 0913   BILITOT 0.4 08/25/2013 0913   GFRNONAA 60* 09/22/2012 1230   GFRAA 69* 09/22/2012 1230       05/10/2013 US RUQ: elevated transaminases Suspect mild fatty infiltration of the liver.  04/04/2013 Hep B surface Ag negative, Hep C AB negative. Sed rate 6, ANA negative, Ferriti 47, Iron 56.  05/05/2012 Colonoscopy:   Findings:  Prep excellent. Few scattered diverticula at sigmoid colon along with one at hepatic flexure. Normal rectal mucosa and anal rectal junction.      Review of Systems Past Medical History  Diagnosis Date  . Arthritis     bilateral in hands  . Hypertension   . Asthma     history of childhood asthma  . High cholesterol     Past Surgical History  Procedure Laterality Date  . Tubal ligation    . Colonoscopy N/A 05/05/2012    Procedure:  COLONOSCOPY;  Surgeon: Malissa HippoNajeeb U Rehman, MD;  Location: [facility];  Service: Endoscopy;  Laterality: N/A;  1030    Allergies  Allergen Reactions  . Sulfonamide Derivatives Hives    Current Outpatient Prescriptions on File Prior to Visit  Medication Sig Dispense Refill  . cetirizine (ZYRTEC) 10 MG tablet Take 10 mg by mouth daily.    Marland Kitchen. esomeprazole (NEXIUM) 20 MG capsule Take 20 mg by mouth as needed.    . fluticasone (FLONASE) 50 MCG/ACT nasal spray Place 1 spray into the nose daily. 16 g 2  . guaiFENesin-codeine 100-10 MG/5ML syrup Take 10ml po q 6 hours prn cough 180 mL 0  . hydrochlorothiazide (HYDRODIURIL) 25 MG tablet Take 1 tablet (25 mg total) by mouth daily. 90 tablet 2  . HYDROcodone-acetaminophen (NORCO) 5-325 MG per tablet Take 1 tablet by mouth every 6 (six) hours as needed for moderate pain. 30 tablet 0  . ibuprofen (ADVIL,MOTRIN) 800 MG tablet Take 1 tablet (800 mg total) by mouth every 8 (eight) hours as needed. 30 tablet 2  . Multiple Vitamins-Minerals (ALIVE WOMENS 50+ PO) Take 1 tablet by mouth daily.    . simvastatin (ZOCOR) 20 MG tablet TAKE 1 TABLET BY  MOUTH AT BEDTIME 90 tablet 1   No current facility-administered medications on file prior to visit.        Objective:   Physical Exam  Filed Vitals:   12/06/13 0948  Height: 5\' 7"  (1.702 m)  Weight: 280 lb 6.4 oz (127.189 kg)   Alert and oriented. Skin warm and dry. Oral mucosa is moist.   . Sclera anicteric, conjunctivae is pink. Thyroid not enlarged. No cervical lymphadenopathy. Lungs clear. Heart regular rate and rhythm.  Abdomen is soft. Bowel sounds are positive. No hepatomegaly. No abdominal masses felt. No tenderness.  No edema to lower extremities.        Assessment & Plan:   Fatty liver, elevated liver enzymes. Patient needs to lose weight and exercise. Will get Alpha 1 antitrypsin, Hepatic function, CBC, SMA OV in 6 months with Dr. Karilyn Cotaehman.

## 2013-12-06 NOTE — Telephone Encounter (Signed)
.  Per Delrae Renderri Setzer,NP these labs be drawn

## 2013-12-06 NOTE — Patient Instructions (Addendum)
OV in 6 months.  Lose at least 10 pounds before next office visit. Needs to exercise. Patient is agreeable.

## 2013-12-07 LAB — HEPATIC FUNCTION PANEL
ALBUMIN: 4.4 g/dL (ref 3.5–5.2)
ALK PHOS: 104 U/L (ref 39–117)
ALT: 42 U/L — ABNORMAL HIGH (ref 0–35)
AST: 55 U/L — ABNORMAL HIGH (ref 0–37)
BILIRUBIN DIRECT: 0.1 mg/dL (ref 0.0–0.3)
Indirect Bilirubin: 0.5 mg/dL (ref 0.2–1.2)
Total Bilirubin: 0.6 mg/dL (ref 0.2–1.2)
Total Protein: 7.5 g/dL (ref 6.0–8.3)

## 2013-12-08 LAB — ALPHA-1-ANTITRYPSIN: A1 ANTITRYPSIN SER: 150 mg/dL (ref 83–199)

## 2013-12-08 LAB — ANTI-SMOOTH MUSCLE ANTIBODY, IGG: Smooth Muscle Ab: 12 U (ref <20)

## 2013-12-08 LAB — MITOCHONDRIAL ANTIBODIES: Mitochondrial M2 Ab, IgG: 0.17 (ref <0.91)

## 2013-12-14 ENCOUNTER — Telehealth (INDEPENDENT_AMBULATORY_CARE_PROVIDER_SITE_OTHER): Payer: Self-pay | Admitting: *Deleted

## 2013-12-14 NOTE — Telephone Encounter (Signed)
addressed

## 2013-12-14 NOTE — Telephone Encounter (Signed)
Patient left message that she was returning your call -- [phone]

## 2013-12-25 ENCOUNTER — Ambulatory Visit (INDEPENDENT_AMBULATORY_CARE_PROVIDER_SITE_OTHER): Payer: BC Managed Care – PPO | Admitting: Family Medicine

## 2013-12-25 ENCOUNTER — Encounter: Payer: Self-pay | Admitting: Family Medicine

## 2013-12-25 VITALS — BP 126/68 | HR 68 | Temp 97.8°F | Resp 14 | Ht 67.0 in | Wt 283.0 lb

## 2013-12-25 DIAGNOSIS — J069 Acute upper respiratory infection, unspecified: Secondary | ICD-10-CM

## 2013-12-25 NOTE — Patient Instructions (Signed)
Take cough medication Plenty of fluids, rest Use Afrin for 3-4 days or Sudafed for no more than 3 days  Robitussin DM for daytime symptoms F/U as needed

## 2013-12-25 NOTE — Progress Notes (Signed)
Patient ID: Haley Mcgrath, female   DOB: 03-31-1960, 53 y.o.   MRN: [id number]   Subjective:    Patient ID: Haley OmsBetty Y Colclough, female    DOB: 03-31-1960, 53 y.o.   MRN: [id number]  Patient presents for Illness  Patient here with sore throat as well as itchy throat congestion and mild cough for the past 24 hours. No fevers associated. She states she cannot breathe out of her nose. She not try any home remedies    Review Of Systems:  GEN- denies fatigue, fever, weight loss,weakness, recent illness HEENT- denies eye drainage, change in vision,+ nasal discharge, CVS- denies chest pain, palpitations RESP- denies SOB, +cough, wheeze ABD- denies N/V, change in stools, abd pain Neuro- denies headache, dizziness, syncope, seizure activity       Objective:    BP 126/68 mmHg  Pulse 68  Temp(Src) 97.8 F (36.6 C) (Oral)  Resp 14  Ht 5\' 7"  (1.702 m)  Wt 283 lb (128.368 kg)  BMI 44.31 kg/m2  LMP 11/17/2013 (Approximate) GEN- NAD, alert and oriented x3 HEENT- PERRL, EOMI, non injected sclera, pink conjunctiva, MMM, oropharynx mild injection, TM clear bilat no effusion,  mild maxillary sinus tenderness, inflammed turbinates,  Nasal drainage  Neck- Supple, no LAD CVS- RRR, no murmur RESP-CTAB Pulses- Radial 2+        Assessment & Plan:      Problem List Items Addressed This Visit    None    Visit Diagnoses    Acute URI    -  Primary    viral illness, supportive care, will allow to decongest for a few days, rest, fluids, cough medication- she still has some Robitussin AC       Note: This dictation was prepared with Dragon dictation along with smaller phrase technology. Any transcriptional errors that result from this process are unintentional.

## 2013-12-26 ENCOUNTER — Ambulatory Visit: Payer: BC Managed Care – PPO | Admitting: Family Medicine

## 2014-01-08 ENCOUNTER — Telehealth: Payer: Self-pay | Admitting: *Deleted

## 2014-01-08 MED ORDER — BENZONATATE 200 MG PO CAPS: 200.0000 mg | ORAL_CAPSULE | Freq: Two times a day (BID) | ORAL | Status: DC | PRN

## 2014-01-08 NOTE — Telephone Encounter (Signed)
There is nothing I can call in for congestion Take sudafed for a few days, also take Mucinex DM You can send tessalon perrles for cough

## 2014-01-08 NOTE — Telephone Encounter (Signed)
Received call from patient.   Reports that cough and head congestion continue.   States that MD gave prescription for guaifenesin with codeine, but states that prescription is too strong for her to take. States that medication knocks her out.   Requested MD call in something for head congestion.   MD please advise.

## 2014-01-08 NOTE — Telephone Encounter (Signed)
Call placed to patient. LMTRC.  

## 2014-01-08 NOTE — Telephone Encounter (Signed)
Patient returned call and made aware.

## 2014-03-14 ENCOUNTER — Encounter: Payer: Self-pay | Admitting: Family Medicine

## 2014-03-14 ENCOUNTER — Ambulatory Visit (INDEPENDENT_AMBULATORY_CARE_PROVIDER_SITE_OTHER): Payer: BLUE CROSS/BLUE SHIELD | Admitting: Family Medicine

## 2014-03-14 VITALS — BP 132/84 | HR 68 | Temp 97.9°F | Resp 16 | Ht 67.0 in | Wt 282.0 lb

## 2014-03-14 DIAGNOSIS — R05 Cough: Secondary | ICD-10-CM

## 2014-03-14 DIAGNOSIS — R058 Other specified cough: Secondary | ICD-10-CM

## 2014-03-14 MED ORDER — FLUTICASONE PROPIONATE 50 MCG/ACT NA SUSP: 1.0000 | Freq: Every day | NASAL | Status: DC

## 2014-03-14 MED ORDER — GUAIFENESIN-CODEINE 100-10 MG/5ML PO SOLN: ORAL | Status: DC

## 2014-03-14 MED ORDER — BENZONATATE 200 MG PO CAPS
200.0000 mg | ORAL_CAPSULE | Freq: Two times a day (BID) | ORAL | Status: DC | PRN
Start: 2014-03-14 — End: 2014-04-30

## 2014-03-14 NOTE — Patient Instructions (Signed)
Restart your flonase Cough medication given  Use mucinex  F/U as previous

## 2014-03-14 NOTE — Progress Notes (Signed)
Patient ID: Haley Mcgrath, female   DOB: 1960-02-03, 54 y.o.   MRN: [id number]   Subjective:    Patient ID: Haley OmsBetty Y Mcgrath, female    DOB: 1960-02-03, 54 y.o.   MRN: [id number]  Patient presents for Cough  COugh non productive for past few days, + runny nose, sneezng, taking allergy pill, out of flonase. Does not feel sick, no fever, no body aches. Request refill on cough medicine, it does keep her up at night. No wheeze, no SOB   Review Of Systems:  GEN- denies fatigue, fever, weight loss,weakness, recent illness HEENT- denies eye drainage, change in vision, nasal discharge, CVS- denies chest pain, palpitations RESP- denies SOB, +cough, wheeze ABD- denies N/V, change in stools, abd pain GU- denies dysuria, hematuria, dribbling, incontinence MSK- denies joint pain, muscle aches, injury Neuro- denies headache, dizziness, syncope, seizure activity       Objective:    BP 132/84 mmHg  Pulse 68  Temp(Src) 97.9 F (36.6 C) (Oral)  Resp 16  Ht 5\' 7"  (1.702 m)  Wt 282 lb (127.914 kg)  BMI 44.16 kg/m2  SpO2 98%  LMP 02/26/2014 (Approximate) GEN- NAD, alert and oriented x3 HEENT- PERRL, EOMI, non injected sclera, pink conjunctiva, MMM, oropharynx clear  TM clear bilat no effusion, no maxillary sinus tenderness, +  Nasal drainage  Neck- Supple, no LAD CVS- RRR, no murmur RESP-CTAB Pulses- Radial 2+         Assessment & Plan:      Problem List Items Addressed This Visit    None    Visit Diagnoses    Allergic cough    -  Primary    More of allergic cough, appears well otherwise, wll have her use flonase with claritin, I did refill cough meds for symptom relief, no antibiotics needed, call if she gets worse       Note: This dictation was prepared with Dragon dictation along with smaller phrase technology. Any transcriptional errors that result from this process are unintentional.

## 2014-03-30 ENCOUNTER — Other Ambulatory Visit: Payer: Self-pay | Admitting: Family Medicine

## 2014-04-10 ENCOUNTER — Ambulatory Visit (INDEPENDENT_AMBULATORY_CARE_PROVIDER_SITE_OTHER): Payer: BLUE CROSS/BLUE SHIELD | Admitting: Family Medicine

## 2014-04-10 ENCOUNTER — Encounter: Payer: Self-pay | Admitting: Family Medicine

## 2014-04-10 VITALS — BP 130/78 | HR 82 | Temp 100.2°F | Resp 16 | Ht 67.0 in | Wt 279.0 lb

## 2014-04-10 DIAGNOSIS — B349 Viral infection, unspecified: Secondary | ICD-10-CM | POA: Diagnosis not present

## 2014-04-10 DIAGNOSIS — B9789 Other viral agents as the cause of diseases classified elsewhere: Secondary | ICD-10-CM

## 2014-04-10 DIAGNOSIS — J0100 Acute maxillary sinusitis, unspecified: Secondary | ICD-10-CM

## 2014-04-10 DIAGNOSIS — J988 Other specified respiratory disorders: Secondary | ICD-10-CM

## 2014-04-10 DIAGNOSIS — R5081 Fever presenting with conditions classified elsewhere: Secondary | ICD-10-CM

## 2014-04-10 LAB — INFLUENZA A AND B
Inflenza A Ag: NEGATIVE
Influenza B Ag: NEGATIVE

## 2014-04-10 MED ORDER — AMOXICILLIN 875 MG PO TABS: 875.0000 mg | ORAL_TABLET | Freq: Two times a day (BID) | ORAL | Status: DC

## 2014-04-10 NOTE — Patient Instructions (Addendum)
Take the antibiotics, cough medicine Use the nasal saline F/u as needed

## 2014-04-10 NOTE — Progress Notes (Signed)
Patient ID: Haley Mcgrath, female   DOB: 08/17/1960, 54 y.o.   MRN: [id number]   Subjective:    Patient ID: Haley OmsBetty Y Mcgrath, female    DOB: 08/17/1960, 54 y.o.   MRN: [id number]  Patient presents for Illness  Patient here with recurrent illness. She was seen a few weeks ago at that time had more of an allergic cough and rhinitis she states that she improved some but then the past couple days has had worsening cough with congestion sinus pressure and drainage mostly as well as low-grade fever. Her husband was seen in the ER diagnosed with flulike illness. She's not had any nausea vomiting she did have 1 day of diarrhea. She's been using her cough medicine which does help as well as her allergy/nasal medications   Review Of Systems:  GEN- + fatigue, +fever, weight loss,weakness, recent illness HEENT- denies eye drainage, change in vision, +nasal discharge, CVS- denies chest pain, palpitations RESP- denies SOB, +cough, wheeze ABD- denies N/V, change in stools, abd pain GU- denies dysuria, hematuria, dribbling, incontinence MSK- denies joint pain, muscle aches, injury Neuro- denies headache, dizziness, syncope, seizure activity       Objective:    BP 130/78 mmHg  Pulse 82  Temp(Src) 100.2 F (37.9 C) (Oral)  Resp 16  Ht 5\' 7"  (1.702 m)  Wt 279 lb (126.554 kg)  BMI 43.69 kg/m2  LMP 02/26/2014 GEN- NAD, alert and oriented x3,febrile HEENT- PERRL, EOMI, non injected sclera, pink conjunctiva, MMM, oropharynx mild injection, TM clear bilat no effusion,  + maxillary sinus tenderness, inflammed turbinates,  Nasal drainage  Neck- Supple, no LAD CVS- RRR, no murmur RESP-CTAB EXT- No edema Pulses- Radial 2+          Assessment & Plan:      Problem List Items Addressed This Visit    None    Visit Diagnoses    Fever presenting with conditions classified elsewhere    -  Primary    Relevant Orders    Influenza a and b (Completed)    Acute maxillary sinusitis, recurrence not  specified        This is a second illness and went to treat her for the sinusitis we'll place her on amoxicillin 875 twice a day continue the Flonase and allergy meds    Relevant Medications    amoxicillin (AMOXIL) tablet    Viral respiratory infection        Other symptoms more consistent with viral illness her influenza was negative therefore I would not give her any Tamiflu       Note: This dictation was prepared with Dragon dictation along with smaller phrase technology. Any transcriptional errors that result from this process are unintentional.

## 2014-04-30 ENCOUNTER — Telehealth: Payer: Self-pay | Admitting: Family Medicine

## 2014-04-30 MED ORDER — BENZONATATE 200 MG PO CAPS: 200.0000 mg | ORAL_CAPSULE | Freq: Two times a day (BID) | ORAL | Status: DC | PRN

## 2014-04-30 NOTE — Telephone Encounter (Signed)
Patient says she got antibiotic last week for a virus she had, but still cannot get rid of her cough  Would like to know if something can be called in for her  [facility] [place]  If any questions call [phone]

## 2014-04-30 NOTE — Telephone Encounter (Signed)
Tessalon sent to pharmacy

## 2014-05-23 ENCOUNTER — Other Ambulatory Visit: Payer: Self-pay | Admitting: Family Medicine

## 2014-05-23 NOTE — Telephone Encounter (Signed)
Refill appropriate and filled per protocol. 

## 2014-06-06 ENCOUNTER — Encounter (INDEPENDENT_AMBULATORY_CARE_PROVIDER_SITE_OTHER): Payer: Self-pay | Admitting: *Deleted

## 2014-06-06 ENCOUNTER — Encounter (INDEPENDENT_AMBULATORY_CARE_PROVIDER_SITE_OTHER): Payer: Self-pay | Admitting: Internal Medicine

## 2014-06-06 ENCOUNTER — Ambulatory Visit (INDEPENDENT_AMBULATORY_CARE_PROVIDER_SITE_OTHER): Payer: BLUE CROSS/BLUE SHIELD | Admitting: Internal Medicine

## 2014-06-06 VITALS — BP 102/74 | HR 64 | Temp 97.7°F | Ht 66.0 in | Wt 284.1 lb

## 2014-06-06 DIAGNOSIS — K760 Fatty (change of) liver, not elsewhere classified: Secondary | ICD-10-CM

## 2014-06-06 LAB — HEPATIC FUNCTION PANEL
ALBUMIN: 3.8 g/dL (ref 3.5–5.2)
ALK PHOS: 72 U/L (ref 39–117)
ALT: 28 U/L (ref 0–35)
AST: 34 U/L (ref 0–37)
BILIRUBIN TOTAL: 0.5 mg/dL (ref 0.2–1.2)
Bilirubin, Direct: 0.1 mg/dL (ref 0.0–0.3)
Indirect Bilirubin: 0.4 mg/dL (ref 0.2–1.2)
TOTAL PROTEIN: 6.7 g/dL (ref 6.0–8.3)

## 2014-06-06 NOTE — Patient Instructions (Signed)
Hepatic funciton. US abdomen/elastrography Diet and exercise. OV in 6 months

## 2014-06-06 NOTE — Progress Notes (Signed)
Subjective:    Patient ID: Haley Mcgrath, female    DOB: Oct 18, 1960, 54 y.o.   MRN: [id number]  HPI Here today for f/u of elevated liver enzymes/fatty liver. She was last seen in November of 2015. Hep B and C markers negative. ANA, Ferritin, alpha 1 antitrypsin, mitochondrial antibody negative, SMA are all negative.  She tells me she is not exercising. She has gained 5 pounds since her last visit in November. She works 3rd shift at [employer] Her appetite has remained good. No abdominal pain. Usually has a BM daily.     09/22/2012 US abdomen:  Conclusion: Liver is enlarged and shows a degree of fatty change.  While no focal liver lesions are identified, it must be cautioned  that the sensitivity of ultrasound for more subtle liver lesions is  diminished given underlying fatty change.  Portions of pancreas are obscured by gas. Visualized portions of  pancreas appear normal.     Hepatic Function Latest Ref Rng 12/06/2013 08/25/2013 05/01/2013  Total Protein 6.0 - 8.3 g/dL 7.5 6.8 7.0  Albumin 3.5 - 5.2 g/dL 4.4 4.2 4.3  AST 0 - 37 U/L 55(H) 38(H) 65(H)  ALT 0 - 35 U/L 42(H) 35 59(H)  Alk Phosphatase 39 - 117 U/L 104 69 105  Total Bilirubin 0.2 - 1.2 mg/dL 0.6 0.4 0.6  Bilirubin, Direct 0.0 - 0.3 mg/dL 0.1 - 0.1              Review of Systems Past Medical History  Diagnosis Date  . Arthritis     bilateral in hands  . Hypertension   . Asthma     history of childhood asthma  . High cholesterol   . Elevated liver enzymes   . Fatty liver     Past Surgical History  Procedure Laterality Date  . Tubal ligation    . Colonoscopy N/A 05/05/2012    Procedure: COLONOSCOPY;  Surgeon: Rogene Houston, MD;  Location: [facility];  Service: Endoscopy;  Laterality: N/A;  1030    Allergies  Allergen Reactions  . Sulfonamide Derivatives Hives    Current Outpatient Prescriptions on File Prior to Visit  Medication Sig Dispense Refill  . cetirizine (ZYRTEC) 10 MG tablet  Take 10 mg by mouth daily.    Marland Kitchen esomeprazole (NEXIUM) 20 MG capsule Take 20 mg by mouth as needed.    . fluticasone (FLONASE) 50 MCG/ACT nasal spray Place 1 spray into both nostrils daily. 16 g 3  . guaiFENesin-codeine 100-10 MG/5ML syrup Take 40m po q 6 hours prn cough 180 mL 0  . hydrochlorothiazide (HYDRODIURIL) 25 MG tablet TAKE 1 TABLET BY MOUTH EVERY DAY 90 tablet 0  . HYDROcodone-acetaminophen (NORCO) 5-325 MG per tablet Take 1 tablet by mouth every 6 (six) hours as needed for moderate pain. 30 tablet 0  . ibuprofen (ADVIL,MOTRIN) 800 MG tablet Take 1 tablet (800 mg total) by mouth every 8 (eight) hours as needed. 30 tablet 2  . Multiple Vitamins-Minerals (ALIVE WOMENS 50+ PO) Take 1 tablet by mouth daily.    . simvastatin (ZOCOR) 20 MG tablet TAKE 1 TABLET BY MOUTH EVERY NIGHT AT BEDTIME 90 tablet 0  . benzonatate (TESSALON) 200 MG capsule Take 1 capsule (200 mg total) by mouth 2 (two) times daily as needed for cough. (Patient not taking: Reported on 06/06/2014) 20 capsule 0   No current facility-administered medications on file prior to visit.        Objective:   Physical Exam Blood  pressure 102/74, pulse 64, temperature 97.7 F (36.5 C), height _0  (1.676 m), weight 284 lb 1.6 oz (128.867 kg).     Alert and oriented. Skin warm and dry. Oral mucosa is moist.   . Sclera anicteric, conjunctivae is pink. Thyroid not enlarged. No cervical lymphadenopathy. Lungs clear. Heart regular rate and rhythm.  Abdomen is soft. Bowel sounds are positive. No hepatomegaly. No abdominal masses felt. No tenderness.  No edema to lower extremities.  Abdomen is obese      Assessment & Plan:  Elevated liver enzymes with fatty liver.' Hepatic function and US abdomen elastrography today  Patient must diet and exercise.

## 2014-06-07 ENCOUNTER — Other Ambulatory Visit (INDEPENDENT_AMBULATORY_CARE_PROVIDER_SITE_OTHER): Payer: Self-pay | Admitting: Internal Medicine

## 2014-06-07 ENCOUNTER — Ambulatory Visit (HOSPITAL_COMMUNITY)
Admission: RE | Admit: 2014-06-07 | Discharge: 2014-06-07 | Disposition: A | Discharge status: Home / Self Care | Payer: BLUE CROSS/BLUE SHIELD | Source: Ambulatory Visit | Attending: Internal Medicine | Admitting: Internal Medicine

## 2014-06-07 ENCOUNTER — Other Ambulatory Visit: Payer: Self-pay | Admitting: Family Medicine

## 2014-06-07 DIAGNOSIS — K760 Fatty (change of) liver, not elsewhere classified: Secondary | ICD-10-CM

## 2014-06-07 DIAGNOSIS — Z1231 Encounter for screening mammogram for malignant neoplasm of breast: Secondary | ICD-10-CM

## 2014-06-25 ENCOUNTER — Encounter: Payer: Self-pay | Admitting: Family Medicine

## 2014-06-25 ENCOUNTER — Ambulatory Visit (INDEPENDENT_AMBULATORY_CARE_PROVIDER_SITE_OTHER): Payer: BLUE CROSS/BLUE SHIELD | Admitting: Family Medicine

## 2014-06-25 VITALS — BP 132/78 | HR 70 | Temp 98.1°F | Resp 18 | Ht 66.0 in | Wt 284.0 lb

## 2014-06-25 DIAGNOSIS — J45901 Unspecified asthma with (acute) exacerbation: Secondary | ICD-10-CM | POA: Insufficient documentation

## 2014-06-25 DIAGNOSIS — J45909 Unspecified asthma, uncomplicated: Secondary | ICD-10-CM | POA: Insufficient documentation

## 2014-06-25 DIAGNOSIS — J4521 Mild intermittent asthma with (acute) exacerbation: Secondary | ICD-10-CM | POA: Diagnosis not present

## 2014-06-25 DIAGNOSIS — J45991 Cough variant asthma: Secondary | ICD-10-CM | POA: Insufficient documentation

## 2014-06-25 MED ORDER — ALBUTEROL SULFATE HFA 108 (90 BASE) MCG/ACT IN AERS: 2.0000 | INHALATION_SPRAY | RESPIRATORY_TRACT | Status: DC | PRN

## 2014-06-25 MED ORDER — BUDESONIDE-FORMOTEROL FUMARATE 80-4.5 MCG/ACT IN AERO: 2.0000 | INHALATION_SPRAY | Freq: Two times a day (BID) | RESPIRATORY_TRACT | Status: DC

## 2014-06-25 MED ORDER — PREDNISONE 20 MG PO TABS: 20.0000 mg | ORAL_TABLET | Freq: Every day | ORAL | Status: DC

## 2014-06-25 NOTE — Patient Instructions (Addendum)
Take 2 puffs twice a day of the symbicort - Call me in 1 week to see if this works  Take steroids as prescribed Use albuterol as a rescue inhaler

## 2014-06-25 NOTE — Assessment & Plan Note (Signed)
Concern for recurrence of asthma based on multiple respiratory infections and cough. Given Prednisone today, Symbicort trial, albuterol prn, has cough med

## 2014-06-25 NOTE — Progress Notes (Signed)
Patient ID: Haley Mcgrath, female   DOB: 1960-12-23, 54 y.o.   MRN: [id number]   Subjective:    Patient ID: Haley OmsBetty Y Kurdziel, female    DOB: 1960-12-23, 54 y.o.   MRN: [id number]  Patient presents for Cough  Pt here with recurrent cough for past 1 week, has had issues with cough, wheezing on and off since winter. Wheezing mostly at night, history of childhood asthma. No fever, no sinus drainage, no sick contacts. Missed work last night due to SOB, and wheezing. No OTC meds taken   Review Of Systems:  GEN- denies fatigue, fever, weight loss,weakness, recent illness HEENT- denies eye drainage, change in vision, nasal discharge, CVS- denies chest pain, palpitations RESP-+ SOB, +cough, +wheeze ABD- denies N/V, change in stools, abd pain GU- denies dysuria, hematuria, dribbling, incontinence MSK- denies joint pain, muscle aches, injury Neuro- denies headache, dizziness, syncope, seizure activity       Objective:    BP 132/78 mmHg  Pulse 70  Temp(Src) 98.1 F (36.7 C) (Oral)  Resp 18  Ht 5\' 6"  (1.676 m)  Wt 284 lb (128.822 kg)  BMI 45.86 kg/m2  SpO2 98%  LMP 03/12/2014 GEN- NAD, alert and oriented x3 HEENT- PERRL, EOMI, non injected sclera, pink conjunctiva, MMM, oropharynx clear Neck- Supple, no LAD, no JVD CVS- RRR, no murmur RESP-scattered wheeze, no rales, normal WOB, peak flow  250/250/200 EXT- No edema Pulses- Radial  2+        Assessment & Plan:      Problem List Items Addressed This Visit      - Primary   Relevant Medications   budesonide-formoterol (SYMBICORT) 80-4.5 MCG/ACT inhaler   albuterol (PROVENTIL HFA;VENTOLIN HFA) 108 (90 BASE) MCG/ACT inhaler   predniSONE (DELTASONE) 20 MG tablet      Note: This dictation was prepared with Dragon dictation along with smaller phrase technology. Any transcriptional errors that result from this process are unintentional.

## 2014-06-29 ENCOUNTER — Ambulatory Visit (HOSPITAL_COMMUNITY)
Admission: RE | Admit: 2014-06-29 | Discharge: 2014-06-29 | Disposition: A | Discharge status: Home / Self Care | Payer: BLUE CROSS/BLUE SHIELD | Source: Ambulatory Visit | Attending: Family Medicine | Admitting: Family Medicine

## 2014-06-29 DIAGNOSIS — Z1231 Encounter for screening mammogram for malignant neoplasm of breast: Secondary | ICD-10-CM | POA: Insufficient documentation

## 2014-07-05 ENCOUNTER — Other Ambulatory Visit: Payer: Self-pay | Admitting: Family Medicine

## 2014-07-05 NOTE — Telephone Encounter (Signed)
Medication refill per protocol °

## 2014-09-04 ENCOUNTER — Other Ambulatory Visit: Payer: Self-pay | Admitting: Family Medicine

## 2014-09-04 NOTE — Telephone Encounter (Signed)
Refill appropriate and filled per protocol. 

## 2014-12-06 ENCOUNTER — Other Ambulatory Visit: Payer: Self-pay | Admitting: Family Medicine

## 2014-12-06 NOTE — Telephone Encounter (Signed)
Refill appropriate and filled per protocol. 

## 2014-12-18 ENCOUNTER — Ambulatory Visit (INDEPENDENT_AMBULATORY_CARE_PROVIDER_SITE_OTHER): Payer: BLUE CROSS/BLUE SHIELD | Admitting: Internal Medicine

## 2014-12-24 ENCOUNTER — Encounter (INDEPENDENT_AMBULATORY_CARE_PROVIDER_SITE_OTHER): Payer: Self-pay | Admitting: *Deleted

## 2015-03-05 ENCOUNTER — Other Ambulatory Visit: Payer: Self-pay | Admitting: Family Medicine

## 2015-03-05 NOTE — Telephone Encounter (Signed)
Refill appropriate and filled per protocol. 

## 2015-03-20 ENCOUNTER — Other Ambulatory Visit: Payer: Self-pay | Admitting: Family Medicine

## 2015-05-22 ENCOUNTER — Encounter: Payer: Self-pay | Admitting: Family Medicine

## 2015-05-22 ENCOUNTER — Ambulatory Visit (INDEPENDENT_AMBULATORY_CARE_PROVIDER_SITE_OTHER): Payer: BLUE CROSS/BLUE SHIELD | Admitting: Family Medicine

## 2015-05-22 VITALS — BP 122/78 | HR 82 | Temp 97.9°F | Resp 18 | Ht 66.0 in | Wt 282.0 lb

## 2015-05-22 DIAGNOSIS — J45901 Unspecified asthma with (acute) exacerbation: Secondary | ICD-10-CM

## 2015-05-22 DIAGNOSIS — J0100 Acute maxillary sinusitis, unspecified: Secondary | ICD-10-CM | POA: Diagnosis not present

## 2015-05-22 DIAGNOSIS — J302 Other seasonal allergic rhinitis: Secondary | ICD-10-CM | POA: Diagnosis not present

## 2015-05-22 MED ORDER — BUDESONIDE-FORMOTEROL FUMARATE 80-4.5 MCG/ACT IN AERO: 2.0000 | INHALATION_SPRAY | Freq: Two times a day (BID) | RESPIRATORY_TRACT | Status: DC

## 2015-05-22 MED ORDER — PREDNISONE 20 MG PO TABS: 20.0000 mg | ORAL_TABLET | Freq: Every day | ORAL | Status: DC

## 2015-05-22 MED ORDER — AZITHROMYCIN 250 MG PO TABS: ORAL_TABLET | ORAL | Status: DC

## 2015-05-22 MED ORDER — BENZONATATE 100 MG PO CAPS: 100.0000 mg | ORAL_CAPSULE | Freq: Three times a day (TID) | ORAL | Status: DC | PRN

## 2015-05-22 NOTE — Assessment & Plan Note (Signed)
Continue flonase and zyrtec 

## 2015-05-22 NOTE — Patient Instructions (Signed)
Start antibiotics ZPAK if you do not improve by weekend Take prednisone, cough pills Start symbicort  F/u Schedule a physical

## 2015-05-22 NOTE — Progress Notes (Signed)
Patient ID: Haley Mcgrath, female   DOB: 1960-11-24, 55 y.o.   MRN: [id number]    Subjective:    Patient ID: Haley OmsBetty Y Mcgrath, female    DOB: 1960-11-24, 55 y.o.   MRN: [id number]  Patient presents for Cough Patient here with cough and mild production for the past few days. It is mostly from drainage from her allergies. But is now causing follows with her asthma. She's noticed some wheezing and felt a little short of breath at times. She is not using her Symbicort but she is using her albuterol. She is also taking her allergy medication. The cough is keeping her up at night. She has not had any fever.    Review Of Systems:  GEN- denies fatigue, fever, weight loss,weakness, recent illness HEENT- denies eye drainage, change in vision, nasal discharge, CVS- denies chest pain, palpitations RESP- denies SOB, +cough, +wheeze ABD- denies N/V, change in stools, abd pain GU- denies dysuria, hematuria, dribbling, incontinence MSK- denies joint pain, muscle aches, injury Neuro- denies headache, dizziness, syncope, seizure activity       Objective:    BP 122/78 mmHg  Pulse 82  Temp(Src) 97.9 F (36.6 C) (Oral)  Resp 18  Ht 5\' 6"  (1.676 m)  Wt 282 lb (127.914 kg)  BMI 45.54 kg/m2  SpO2 97% GEN- NAD, alert and oriented x3 HEENT- PERRL, EOMI, non injected sclera, pink conjunctiva, MMM, oropharynx clear, Right TM clear fluid, no erythema, Left TM clear, nares clear rhinorrhea, mild maxillary sinus tenderness, left side  Neck- Supple, +LAD CVS- RRR, no murmur RESP-few scattered wheeze, normal WOB  ABD-NABS,soft,NT,ND EXT- No edema Pulses- Radial, DP- 2+        Assessment & Plan:      Problem List Items Addressed This Visit    Seasonal allergies - Primary    Continue flonase and zyrtec        Other Visit Diagnoses    Acute maxillary sinusitis, recurrence not specified        Mild sinusitis, has had recurrent in past, not quite bacterial so given script to hold onto if not improved  in a week, treat asthma     Relevant Medications    benzonatate (TESSALON) 100 MG capsule    predniSONE (DELTASONE) 20 MG tablet    azithromycin (ZITHROMAX) 250 MG tablet    Asthma exacerbation        2/2 Pollen exposure. Prednisone, albuterol, restart symbicort     Relevant Medications    predniSONE (DELTASONE) 20 MG tablet    budesonide-formoterol (SYMBICORT) 80-4.5 MCG/ACT inhaler       Note: This dictation was prepared with Dragon dictation along with smaller phrase technology. Any transcriptional errors that result from this process are unintentional.

## 2015-06-06 ENCOUNTER — Other Ambulatory Visit: Payer: Self-pay | Admitting: Family Medicine

## 2015-06-07 NOTE — Telephone Encounter (Signed)
Refill appropriate and filled per protocol. 

## 2015-08-20 DIAGNOSIS — J0100 Acute maxillary sinusitis, unspecified: Secondary | ICD-10-CM | POA: Diagnosis not present

## 2015-08-20 DIAGNOSIS — J40 Bronchitis, not specified as acute or chronic: Secondary | ICD-10-CM | POA: Diagnosis not present

## 2015-09-10 ENCOUNTER — Other Ambulatory Visit: Payer: Self-pay | Admitting: Family Medicine

## 2015-09-10 NOTE — Telephone Encounter (Signed)
Refill appropriate and filled per protocol. 

## 2015-10-16 DIAGNOSIS — J0100 Acute maxillary sinusitis, unspecified: Secondary | ICD-10-CM | POA: Diagnosis not present

## 2015-12-17 ENCOUNTER — Other Ambulatory Visit: Payer: Self-pay | Admitting: Family Medicine

## 2016-01-15 DIAGNOSIS — J329 Chronic sinusitis, unspecified: Secondary | ICD-10-CM | POA: Diagnosis not present

## 2016-01-15 DIAGNOSIS — J40 Bronchitis, not specified as acute or chronic: Secondary | ICD-10-CM | POA: Diagnosis not present

## 2016-01-15 DIAGNOSIS — J069 Acute upper respiratory infection, unspecified: Secondary | ICD-10-CM | POA: Diagnosis not present

## 2016-03-18 ENCOUNTER — Other Ambulatory Visit: Payer: Self-pay | Admitting: Family Medicine

## 2016-04-25 DIAGNOSIS — J9801 Acute bronchospasm: Secondary | ICD-10-CM | POA: Diagnosis not present

## 2016-04-25 DIAGNOSIS — R05 Cough: Secondary | ICD-10-CM | POA: Diagnosis not present

## 2016-04-25 DIAGNOSIS — J019 Acute sinusitis, unspecified: Secondary | ICD-10-CM | POA: Diagnosis not present

## 2016-04-25 DIAGNOSIS — J189 Pneumonia, unspecified organism: Secondary | ICD-10-CM | POA: Diagnosis not present

## 2016-04-25 DIAGNOSIS — I10 Essential (primary) hypertension: Secondary | ICD-10-CM | POA: Diagnosis not present

## 2016-04-29 DIAGNOSIS — J019 Acute sinusitis, unspecified: Secondary | ICD-10-CM | POA: Diagnosis not present

## 2016-04-29 DIAGNOSIS — R05 Cough: Secondary | ICD-10-CM | POA: Diagnosis not present

## 2016-04-29 DIAGNOSIS — J09X2 Influenza due to identified novel influenza A virus with other respiratory manifestations: Secondary | ICD-10-CM | POA: Diagnosis not present

## 2016-04-29 DIAGNOSIS — J189 Pneumonia, unspecified organism: Secondary | ICD-10-CM | POA: Diagnosis not present

## 2016-05-07 ENCOUNTER — Encounter: Payer: Self-pay | Admitting: Family Medicine

## 2016-05-26 ENCOUNTER — Other Ambulatory Visit: Payer: Self-pay | Admitting: Family Medicine

## 2016-06-03 ENCOUNTER — Encounter: Payer: Self-pay | Admitting: Family Medicine

## 2016-06-30 ENCOUNTER — Other Ambulatory Visit: Payer: Self-pay | Admitting: Family Medicine

## 2016-06-30 NOTE — Telephone Encounter (Signed)
Medication filled x1 with no refills.   Requires office visit before any further refills can be given.   Letter sent.  

## 2016-08-04 ENCOUNTER — Ambulatory Visit (INDEPENDENT_AMBULATORY_CARE_PROVIDER_SITE_OTHER): Payer: BLUE CROSS/BLUE SHIELD | Admitting: Family Medicine

## 2016-08-04 ENCOUNTER — Encounter: Payer: Self-pay | Admitting: Family Medicine

## 2016-08-04 VITALS — BP 138/73 | HR 88 | Temp 97.9°F | Resp 14 | Ht 66.0 in | Wt 281.0 lb

## 2016-08-04 DIAGNOSIS — F439 Reaction to severe stress, unspecified: Secondary | ICD-10-CM

## 2016-08-04 DIAGNOSIS — E782 Mixed hyperlipidemia: Secondary | ICD-10-CM | POA: Diagnosis not present

## 2016-08-04 DIAGNOSIS — K760 Fatty (change of) liver, not elsewhere classified: Secondary | ICD-10-CM | POA: Diagnosis not present

## 2016-08-04 DIAGNOSIS — M7711 Lateral epicondylitis, right elbow: Secondary | ICD-10-CM

## 2016-08-04 DIAGNOSIS — J4540 Moderate persistent asthma, uncomplicated: Secondary | ICD-10-CM | POA: Diagnosis not present

## 2016-08-04 DIAGNOSIS — I10 Essential (primary) hypertension: Secondary | ICD-10-CM

## 2016-08-04 LAB — CBC WITH DIFFERENTIAL/PLATELET
BASOS ABS: 56 {cells}/uL (ref 0–200)
Basophils Relative: 1 %
EOS ABS: 392 {cells}/uL (ref 15–500)
Eosinophils Relative: 7 %
HEMATOCRIT: 38.3 % (ref 35.0–45.0)
Hemoglobin: 12.3 g/dL (ref 12.0–15.0)
LYMPHS ABS: 2408 {cells}/uL (ref 850–3900)
Lymphocytes Relative: 43 %
MCH: 29.4 pg (ref 27.0–33.0)
MCHC: 32.1 g/dL (ref 32.0–36.0)
MCV: 91.6 fL (ref 80.0–100.0)
MPV: 8.9 fL (ref 7.5–12.5)
Monocytes Absolute: 392 cells/uL (ref 200–950)
Monocytes Relative: 7 %
NEUTROS ABS: 2352 {cells}/uL (ref 1500–7800)
Neutrophils Relative %: 42 %
Platelets: 267 10*3/uL (ref 140–400)
RBC: 4.18 MIL/uL (ref 3.80–5.10)
RDW: 15 % (ref 11.0–15.0)
WBC: 5.6 10*3/uL (ref 3.8–10.8)

## 2016-08-04 LAB — COMPREHENSIVE METABOLIC PANEL
ALBUMIN: 4.1 g/dL (ref 3.6–5.1)
ALT: 29 U/L (ref 6–29)
AST: 29 U/L (ref 10–35)
Alkaline Phosphatase: 96 U/L (ref 33–130)
BUN: 13 mg/dL (ref 7–25)
CALCIUM: 9.4 mg/dL (ref 8.6–10.4)
CHLORIDE: 109 mmol/L (ref 98–110)
CO2: 24 mmol/L (ref 20–31)
CREATININE: 0.98 mg/dL (ref 0.50–1.05)
Glucose, Bld: 87 mg/dL (ref 70–99)
Potassium: 4.5 mmol/L (ref 3.5–5.3)
SODIUM: 140 mmol/L (ref 135–146)
TOTAL PROTEIN: 6.7 g/dL (ref 6.1–8.1)
Total Bilirubin: 0.5 mg/dL (ref 0.2–1.2)

## 2016-08-04 LAB — LIPID PANEL
CHOLESTEROL: 190 mg/dL (ref <200)
HDL: 56 mg/dL (ref >50)
LDL Cholesterol: 120 mg/dL — ABNORMAL HIGH (ref <100)
Total CHOL/HDL Ratio: 3.4 Ratio (ref <5.0)
Triglycerides: 68 mg/dL (ref <150)
VLDL: 14 mg/dL (ref <30)

## 2016-08-04 MED ORDER — SIMVASTATIN 20 MG PO TABS: 20.0000 mg | ORAL_TABLET | Freq: Every day | ORAL | 1 refills | Status: DC

## 2016-08-04 MED ORDER — HYDROCHLOROTHIAZIDE 25 MG PO TABS: 25.0000 mg | ORAL_TABLET | Freq: Every day | ORAL | 1 refills | Status: DC

## 2016-08-04 MED ORDER — HYDROCHLOROTHIAZIDE 25 MG PO TABS
25.0000 mg | ORAL_TABLET | Freq: Every day | ORAL | 1 refills | Status: DC
Start: 2016-08-04 — End: 2018-08-04

## 2016-08-04 NOTE — Addendum Note (Signed)
Addended by: Phillips OdorSIX, CHRISTINA H on: 08/04/2016 04:23 PM   Modules accepted: Orders

## 2016-08-04 NOTE — Patient Instructions (Signed)
F/U as previous for physical We will call with lab results

## 2016-08-04 NOTE — Progress Notes (Signed)
Subjective:    Patient ID: Haley Mcgrath, female    DOB: 1960/06/13, 56 y.o.   MRN: [id number]  Patient presents for Medication Management (review/ refill- is fasting)  Pt here to f/u chroic medical problems last visit April 2017  Asthma- taking symbicortOnly when she needs it.   Hypertension - hydrochlorothiazide every other day and she isn't spreading out her medications. Hyperlipidemia- due for lipids, on zocor , morbidly obese   Right elbow pain- And off for the past few months. She does do a lot of repetitive motions at work., used ibuprofen and topical cream from sister, when it flares denies any tingling or numbness in her hand. She does have remote right shoulder injury.  Based on above she states that she isn't stressed over the past year. Her husband has lost 3 different jobs he is not trying to help financially. She is to try to help her daughter. She got married but doesn't have transportation she is working as many hours that she can. She is very overwhelmed and she is decided to actually leave her husband. She states that the past 3-6 month she cannot afford to get her medications on a regular basis and breast with spreading them out. She knew that she was overdue for her labs and can get refills therefore she came in today. She is hoping that she can get back on track now that her daughter situation has improved.  Review Of Systems:  GEN- denies fatigue, fever, weight loss,weakness, recent illness HEENT- denies eye drainage, change in vision, nasal discharge, CVS- denies chest pain, palpitations RESP- denies SOB, cough, wheeze ABD- denies N/V, change in stools, abd pain GU- denies dysuria, hematuria, dribbling, incontinence MSK- + joint pain, muscle aches, injury Neuro- denies headache, dizziness, syncope, seizure activity       Objective:    BP 138/73   Pulse 88   Temp 97.9 F (36.6 C) (Oral)   Resp 14   Ht 5\' 6"  (1.676 m)   Wt 281 lb (127.5 kg)   SpO2 98%   BMI  45.35 kg/m  GEN- NAD, alert and oriented x3 HEENT- PERRL, EOMI, non injected sclera, pink conjunctiva, MMM, oropharynx clear Neck- Supple, no thyromegaly CVS- RRR, no murmur RESP-CTAB ABD-NABS,soft,NT,ND MSK- TTP lateral epicondyle and tendon to forearm, pain with rotation, no swelling noted, good ROM Shoulder, normal inspection arm  EXT- No edema Psych- normal affect and mood Pulses- Radial, DP- 2+        Assessment & Plan:      Problem List Items Addressed This Visit    Fatty liver   Morbid obesity (HCC)   Hyperlipidemia    Continue Zocor with her obesity she also has fatty liver disease.      Relevant Medications   hydrochlorothiazide (HYDRODIURIL) 25 MG tablet   simvastatin (ZOCOR) 20 MG tablet   Other Relevant Orders   Lipid panel   Essential hypertension, benign - Primary    Blood pressure looks okay today and sending for morbid obesity. We will check her labs today she'll try to get her hydrochlorothiazide intake on a regular basis as she does get edema at her ankles after work.      Relevant Medications   hydrochlorothiazide (HYDRODIURIL) 25 MG tablet   simvastatin (ZOCOR) 20 MG tablet   Other Relevant Orders   CBC with Differential/Platelet   Comprehensive metabolic panel   Asthma    No recent exacerbations she was treated for pneumonia last year. I've given her  samples of her Symbicort as well as Allegra       Other Visit Diagnoses    Lateral epicondylitis of right elbow       Unable to afford any new medications or orthopedics. Will use ibuprofen and ice she can get an elbow strap   Stress at home       She will call if she needs assistance or meds to help sleep      Note: This dictation was prepared with Dragon dictation along with smaller phrase technology. Any transcriptional errors that result from this process are unintentional.

## 2016-08-04 NOTE — Assessment & Plan Note (Signed)
Continue Zocor with her obesity she also has fatty liver disease.

## 2016-08-04 NOTE — Assessment & Plan Note (Signed)
No recent exacerbations she was treated for pneumonia last year. I've given her samples of her Symbicort as well as Careers adviserAllegra

## 2016-08-04 NOTE — Assessment & Plan Note (Signed)
Blood pressure looks okay today and sending for morbid obesity. We will check her labs today she'll try to get her hydrochlorothiazide intake on a regular basis as she does get edema at her ankles after work.

## 2016-08-21 ENCOUNTER — Other Ambulatory Visit: Payer: Self-pay | Admitting: Family Medicine

## 2016-10-12 ENCOUNTER — Ambulatory Visit (INDEPENDENT_AMBULATORY_CARE_PROVIDER_SITE_OTHER): Payer: BLUE CROSS/BLUE SHIELD | Admitting: Family Medicine

## 2016-10-12 ENCOUNTER — Encounter: Payer: Self-pay | Admitting: Family Medicine

## 2016-10-12 VITALS — BP 124/78 | HR 79 | Temp 97.6°F | Resp 18 | Ht 65.5 in | Wt 273.6 lb

## 2016-10-12 DIAGNOSIS — Z124 Encounter for screening for malignant neoplasm of cervix: Secondary | ICD-10-CM | POA: Diagnosis not present

## 2016-10-12 DIAGNOSIS — Z1239 Encounter for other screening for malignant neoplasm of breast: Secondary | ICD-10-CM

## 2016-10-12 DIAGNOSIS — Z0000 Encounter for general adult medical examination without abnormal findings: Secondary | ICD-10-CM | POA: Diagnosis not present

## 2016-10-12 DIAGNOSIS — I10 Essential (primary) hypertension: Secondary | ICD-10-CM

## 2016-10-12 DIAGNOSIS — Z1231 Encounter for screening mammogram for malignant neoplasm of breast: Secondary | ICD-10-CM

## 2016-10-12 MED ORDER — BUDESONIDE-FORMOTEROL FUMARATE 80-4.5 MCG/ACT IN AERO
2.0000 | INHALATION_SPRAY | Freq: Two times a day (BID) | RESPIRATORY_TRACT | 6 refills | Status: DC
Start: 2016-10-12 — End: 2018-04-06

## 2016-10-12 MED ORDER — ALBUTEROL SULFATE HFA 108 (90 BASE) MCG/ACT IN AERS: 2.0000 | INHALATION_SPRAY | RESPIRATORY_TRACT | 2 refills | Status: DC | PRN

## 2016-10-12 NOTE — Progress Notes (Signed)
   Subjective:    Patient ID: Haley Mcgrath, female    DOB: February 22, 1960, 56 y.o.   MRN: [id number]  Patient presents for Annual Exam  Pt here for CPE  Mammogram- Due, last in 2016 PAP Smear Due - last in 2015 normal   Colonoscpy UTD Due for fasting labs  Immuniations- due for flu shot   Obesity- down 8lbs   Had recent labs, LDL was 120, was not takng statin consistently   Asthma- has been good, using samples of the symbicort at this time No new concerns       Review Of Systems:  GEN- denies fatigue, fever, weight loss,weakness, recent illness HEENT- denies eye drainage, change in vision, nasal discharge, CVS- denies chest pain, palpitations RESP- denies SOB, cough, wheeze ABD- denies N/V, change in stools, abd pain GU- denies dysuria, hematuria, dribbling, incontinence MSK- denies joint pain, muscle aches, injury Neuro- denies headache, dizziness, syncope, seizure activity       Objective:    BP 124/78   Pulse 79   Temp 97.6 F (36.4 C) (Oral)   Resp 18   Ht 5' 5.5" (1.664 m)   Wt 273 lb 9.6 oz (124.1 kg)   SpO2 98%   BMI 44.84 kg/m  GEN- NAD, alert and oriented x3,obese HEENT- PERRL, EOMI, non injected sclera, pink conjunctiva, MMM, oropharynx clear Neck- Supple, no thyromegaly Breast- normal symmetry, no nipple inversion,no nipple drainage, no nodules or lumps felt, large breast size  Nodes- no axillary nodes CVS- RRR, no murmur RESP-CTAB ABD-NABS,soft,NT,ND GU- normal external genitalia, vaginal mucosa pink and moist, cervix visualized no growth, no blood form os, minimal thin clear discharge, no CMT, no ovarian masses, uterus normal size EXT- No edema Pulses- Radial, DP- 2+        Assessment & Plan:      Problem List Items Addressed This Visit      Unprioritized   Morbid obesity (HCC)   Essential hypertension, benign    Other Visit Diagnoses    Routine general medical examination at a health care facility    -  Primary   CPE done, PAP Smear  done, pt to schedule mammogram, Flu shot at work, declines HIV screening. HTN controlled. Some weight loss noted, continue to encouarge healthy eating/exercise, weight loss    Breast cancer screening       Relevant Orders   MM SCREENING BREAST TOMO BILATERAL   Cervical cancer screening       Relevant Orders   Pap IG, CT/NG w/ reflex HPV when ASC-U [employer]      Note: This dictation was prepared with Office managerDragon dictation along with smaller Lobbyistphrase technology. Any transcriptional errors that result from this process are unintentional.

## 2016-10-12 NOTE — Patient Instructions (Addendum)
F/U 4 months  We will call with PAP Smear results  Schedule your mammogram Flu shot at work

## 2016-10-14 ENCOUNTER — Encounter: Payer: Self-pay | Admitting: Family Medicine

## 2016-10-15 LAB — PAP IG, CT-NG, RFX HPV ASCU
C. trachomatis RNA, TMA: NOT DETECTED
N. GONORRHOEAE RNA, TMA: NOT DETECTED

## 2016-10-16 ENCOUNTER — Other Ambulatory Visit: Payer: Self-pay

## 2016-10-16 MED ORDER — METRONIDAZOLE 500 MG PO TABS: 500.0000 mg | ORAL_TABLET | Freq: Two times a day (BID) | ORAL | 0 refills | Status: DC

## 2016-10-20 ENCOUNTER — Telehealth: Payer: Self-pay | Admitting: *Deleted

## 2016-10-20 MED ORDER — ALBUTEROL SULFATE HFA 108 (90 BASE) MCG/ACT IN AERS: 2.0000 | INHALATION_SPRAY | RESPIRATORY_TRACT | 2 refills | Status: DC | PRN

## 2016-10-20 NOTE — Telephone Encounter (Signed)
Received PA determination from [facility] for Proventil.   PA has been denied.   Formulary alternatives include ProAir, ProAir Respiclick, and Ventolin.   Medication changed to Ventolin.

## 2016-10-21 ENCOUNTER — Other Ambulatory Visit: Payer: Self-pay | Admitting: Family Medicine

## 2016-10-21 ENCOUNTER — Ambulatory Visit (HOSPITAL_COMMUNITY)
Admission: RE | Admit: 2016-10-21 | Discharge: 2016-10-21 | Disposition: A | Discharge status: Home / Self Care | Payer: BLUE CROSS/BLUE SHIELD | Source: Ambulatory Visit | Attending: Family Medicine | Admitting: Family Medicine

## 2016-10-21 DIAGNOSIS — Z1239 Encounter for other screening for malignant neoplasm of breast: Secondary | ICD-10-CM

## 2016-10-21 DIAGNOSIS — Z1231 Encounter for screening mammogram for malignant neoplasm of breast: Secondary | ICD-10-CM

## 2016-11-13 ENCOUNTER — Ambulatory Visit: Payer: BLUE CROSS/BLUE SHIELD | Admitting: Family Medicine

## 2016-11-18 ENCOUNTER — Ambulatory Visit (INDEPENDENT_AMBULATORY_CARE_PROVIDER_SITE_OTHER): Payer: BLUE CROSS/BLUE SHIELD | Admitting: Family Medicine

## 2016-11-18 ENCOUNTER — Encounter: Payer: Self-pay | Admitting: Family Medicine

## 2016-11-18 VITALS — BP 138/72 | HR 100 | Temp 98.3°F | Resp 16 | Ht 65.5 in | Wt 272.0 lb

## 2016-11-18 DIAGNOSIS — J4521 Mild intermittent asthma with (acute) exacerbation: Secondary | ICD-10-CM | POA: Diagnosis not present

## 2016-11-18 DIAGNOSIS — J069 Acute upper respiratory infection, unspecified: Secondary | ICD-10-CM

## 2016-11-18 MED ORDER — PREDNISONE 20 MG PO TABS: 40.0000 mg | ORAL_TABLET | Freq: Every day | ORAL | 0 refills | Status: DC

## 2016-11-18 MED ORDER — GUAIFENESIN-CODEINE 100-10 MG/5ML PO SOLN: 5.0000 mL | Freq: Four times a day (QID) | ORAL | 0 refills | Status: DC | PRN

## 2016-11-18 NOTE — Patient Instructions (Signed)
Take Prednisone Cough medicine F/U AS NEEDED

## 2016-11-18 NOTE — Progress Notes (Signed)
   Subjective:    Patient ID: Haley Mcgrath, female    DOB: [birth year], 56 y.o.   MRN: [id number]  Patient presents for Cough (x10 days- nonproductive cough- is going on cruise and would like to be checked out before she leaves)  Pt here with cough, non productive for the past 10 days, No fever, NO SINUS DRAINAGE No significant wheezing more coughingspells      Tried some OTC cough medicine  She is uring symbicort, has not required albuterol   Has been taking allergy pill   She still under stress that she is contemplating leaving her husband is not helping with her at home. We have had a few discussions about the and her son  She is going on a cruise next week  And is going to take that time to make her decision   Review Of Systems:  GEN- denies fatigue, fever, weight loss,weakness, recent illness HEENT- denies eye drainage, change in vision, nasal discharge, CVS- denies chest pain, palpitations RESP- denies SOB, +cough, wheeze ABD- denies N/V, change in stools, abd pain GU- denies dysuria, hematuria, dribbling, incontinence MSK- denies joint pain, muscle aches, injury Neuro- denies headache, dizziness, syncope, seizure activity       Objective:    BP 138/72   Pulse 100   Temp 98.3 F (36.8 C) (Oral)   Resp 16   Ht 5' 5.5" (1.664 m)   Wt 272 lb (123.4 kg)   SpO2 96%   BMI 44.57 kg/m  GEN- NAD, alert and oriented x3 HEENT- PERRL, EOMI, non injected sclera, pink conjunctiva, MMM, oropharynx clear Neck- Supple, no LAD  CVS- RRR, no murmur RESP-CTAB Psych- stressed appearing, not anxious, not depressed, no SI EXT- No edema Pulses- Radial 2+       Peak Flow 300/ 300/300 Assessment & Plan:      Problem List Items Addressed This Visit    None    Visit Diagnoses    Mild intermittent asthma with exacerbation    -  Primary   Viral URI, causing more cough variant asthma which she often presents as. Given Prednisone, robitussin AC She can call if she needs anything as  far as support with regards to her marriage. She has been to therapy with her husband. She does have supportive children/ siblings   Relevant Medications   predniSONE (DELTASONE) 20 MG tablet   Viral URI          Note: This dictation was prepared with Dragon dictation along with smaller phrase technology. Any transcriptional errors that result from this process are unintentional.

## 2017-01-05 ENCOUNTER — Other Ambulatory Visit: Payer: Self-pay | Admitting: Family Medicine

## 2017-03-15 ENCOUNTER — Other Ambulatory Visit: Payer: Self-pay | Admitting: Family Medicine

## 2017-04-22 ENCOUNTER — Other Ambulatory Visit: Payer: Self-pay | Admitting: Family Medicine

## 2017-04-22 ENCOUNTER — Ambulatory Visit: Payer: BLUE CROSS/BLUE SHIELD | Admitting: Physician Assistant

## 2017-04-22 ENCOUNTER — Encounter: Payer: Self-pay | Admitting: Physician Assistant

## 2017-04-22 VITALS — BP 130/84 | HR 81 | Temp 97.8°F | Resp 16 | Wt 274.0 lb

## 2017-04-22 DIAGNOSIS — B9789 Other viral agents as the cause of diseases classified elsewhere: Secondary | ICD-10-CM | POA: Diagnosis not present

## 2017-04-22 DIAGNOSIS — J988 Other specified respiratory disorders: Secondary | ICD-10-CM

## 2017-04-22 MED ORDER — BENZONATATE 200 MG PO CAPS
200.0000 mg | ORAL_CAPSULE | Freq: Three times a day (TID) | ORAL | 0 refills | Status: DC | PRN
Start: 2017-04-22 — End: 2018-04-06

## 2017-04-22 NOTE — Progress Notes (Signed)
Patient ID: Haley Mcgrath MRN: [name], DOB: Jul 03, 1960, 57 y.o. Date of Encounter: 04/22/2017, 2:21 PM    Chief Complaint:  Chief Complaint  Patient presents with  . Cough    last couple days      HPI: 57 y.o. year old female presents with above.   She reports that she has had a cough just for the last couple of days.  She says that this past weekend she felt okay and was fine.  Says then Monday and Tuesday she started having some cough.  Is that she has not noticed any wheezing.  Says that usually this will "happen once a year and she comes and get some perls and that helps."  Says that the cough is annoying when she is awake and then it is also annoying because she cannot sleep secondary to cough. Has had no nasal congestion or mucus from the nose.  No sore throat.  No fevers or chills.     Home Meds:   Outpatient Medications Prior to Visit  Medication Sig Dispense Refill  . albuterol (PROVENTIL HFA;VENTOLIN HFA) 108 (90 Base) MCG/ACT inhaler Inhale 2 puffs into the lungs every 4 (four) hours as needed for wheezing or shortness of breath. 1 Inhaler 2  . budesonide-formoterol (SYMBICORT) 80-4.5 MCG/ACT inhaler Inhale 2 puffs into the lungs 2 (two) times daily. 1 Inhaler 6  . cetirizine (ZYRTEC) 10 MG tablet Take 10 mg by mouth daily.    Marland Kitchen. guaiFENesin-codeine 100-10 MG/5ML syrup Take 5 mLs by mouth every 6 (six) hours as needed for cough. 120 mL 0  . hydrochlorothiazide (HYDRODIURIL) 25 MG tablet Take 1 tablet (25 mg total) by mouth daily. 90 tablet 1  . ibuprofen (ADVIL,MOTRIN) 800 MG tablet TAKE 1 TABLET BY MOUTH EVERY 8 HOURS AS NEEDED 30 tablet 0  . ibuprofen (ADVIL,MOTRIN) 800 MG tablet TAKE 1 TABLET BY MOUTH EVERY 8 HOURS AS NEEDED 30 tablet 0  . Multiple Vitamins-Minerals (ALIVE WOMENS 50+ PO) Take 1 tablet by mouth daily.    . simvastatin (ZOCOR) 20 MG tablet Take 1 tablet (20 mg total) by mouth daily. 90 tablet 1  . esomeprazole (NEXIUM) 20 MG capsule Take 20 mg by  mouth as needed.    . predniSONE (DELTASONE) 20 MG tablet Take 2 tablets (40 mg total) by mouth daily with breakfast. (Patient not taking: Reported on 04/22/2017) 10 tablet 0  . hydrochlorothiazide (HYDRODIURIL) 25 MG tablet TAKE 1 TABLET(25 MG) BY MOUTH DAILY 90 tablet 0   No facility-administered medications prior to visit.     Allergies:  Allergies  Allergen Reactions  . Sulfonamide Derivatives Hives      Review of Systems: See HPI for pertinent ROS. All other ROS negative.    Physical Exam: Blood pressure 130/84, pulse 81, temperature 97.8 F (36.6 C), temperature source Oral, resp. rate 16, weight 124.3 kg (274 lb), SpO2 97 %., Body mass index is 44.9 kg/m. General:  Obese AAF. Appears in no acute distress. HEENT: Normocephalic, atraumatic, eyes without discharge, sclera non-icteric, nares are without discharge. Bilateral auditory canals clear, TM's are without perforation, pearly grey and translucent with reflective cone of light bilaterally. Oral cavity moist, posterior pharynx without exudate, erythema, peritonsillar abscess.  No tenderness with percussion to frontal or maxillary sinuses bilaterally.  Neck: Supple. No thyromegaly. No lymphadenopathy. Lungs: Clear bilaterally to auscultation without wheezes, rales, or rhonchi. Breathing is unlabored. Heart: Regular rhythm. No murmurs, rubs, or gallops. Msk:  Strength and tone normal for age.  Extremities/Skin: Warm and dry.  Neuro: Alert and oriented X 3. Moves all extremities spontaneously. Gait is normal. CNII-XII grossly in tact. Psych:  Responds to questions appropriately with a normal affect.     ASSESSMENT AND PLAN:  57 y.o. year old female with  1. Viral respiratory infection Can use the Tessalon Perles as directed for cough suppressant.  Follow-up if develops fever or if symptoms worsen significantly or if symptoms persist greater than 7-10 days. - benzonatate (TESSALON) 200 MG capsule; Take 1 capsule (200 mg total)  by mouth 3 (three) times daily as needed for cough.  Dispense: 20 capsule; Refill: 0   Signed, [address] [place], [place], [facility] 04/22/2017 2:21 PM

## 2017-05-18 ENCOUNTER — Other Ambulatory Visit: Payer: Self-pay | Admitting: Family Medicine

## 2017-07-18 ENCOUNTER — Other Ambulatory Visit: Payer: Self-pay | Admitting: Family Medicine

## 2017-07-27 ENCOUNTER — Other Ambulatory Visit: Payer: Self-pay | Admitting: Family Medicine

## 2017-07-29 ENCOUNTER — Other Ambulatory Visit: Payer: Self-pay | Admitting: Family Medicine

## 2017-10-25 ENCOUNTER — Other Ambulatory Visit: Payer: Self-pay | Admitting: Family Medicine

## 2017-12-02 DIAGNOSIS — R0789 Other chest pain: Secondary | ICD-10-CM | POA: Diagnosis not present

## 2017-12-02 DIAGNOSIS — R05 Cough: Secondary | ICD-10-CM | POA: Diagnosis not present

## 2017-12-02 DIAGNOSIS — Z6841 Body Mass Index (BMI) 40.0 and over, adult: Secondary | ICD-10-CM | POA: Diagnosis not present

## 2017-12-02 DIAGNOSIS — J209 Acute bronchitis, unspecified: Secondary | ICD-10-CM | POA: Diagnosis not present

## 2018-01-30 ENCOUNTER — Other Ambulatory Visit: Payer: Self-pay | Admitting: Family Medicine

## 2018-02-01 ENCOUNTER — Other Ambulatory Visit: Payer: Self-pay | Admitting: Family Medicine

## 2018-04-06 ENCOUNTER — Encounter: Payer: Self-pay | Admitting: Family Medicine

## 2018-04-06 ENCOUNTER — Other Ambulatory Visit: Payer: Self-pay

## 2018-04-06 ENCOUNTER — Ambulatory Visit: Payer: BLUE CROSS/BLUE SHIELD | Admitting: Family Medicine

## 2018-04-06 VITALS — BP 132/62 | HR 86 | Temp 97.9°F | Resp 14 | Ht 69.0 in | Wt 275.0 lb

## 2018-04-06 DIAGNOSIS — I10 Essential (primary) hypertension: Secondary | ICD-10-CM | POA: Diagnosis not present

## 2018-04-06 DIAGNOSIS — J069 Acute upper respiratory infection, unspecified: Secondary | ICD-10-CM | POA: Diagnosis not present

## 2018-04-06 DIAGNOSIS — J45901 Unspecified asthma with (acute) exacerbation: Secondary | ICD-10-CM

## 2018-04-06 DIAGNOSIS — E782 Mixed hyperlipidemia: Secondary | ICD-10-CM | POA: Diagnosis not present

## 2018-04-06 LAB — COMPREHENSIVE METABOLIC PANEL
AG Ratio: 1.8 (calc) (ref 1.0–2.5)
ALT: 46 U/L — ABNORMAL HIGH (ref 6–29)
AST: 51 U/L — ABNORMAL HIGH (ref 10–35)
Albumin: 4.4 g/dL (ref 3.6–5.1)
Alkaline phosphatase (APISO): 108 U/L (ref 37–153)
BUN/Creatinine Ratio: 15 (calc) (ref 6–22)
BUN: 18 mg/dL (ref 7–25)
CO2: 30 mmol/L (ref 20–32)
Calcium: 9.5 mg/dL (ref 8.6–10.4)
Chloride: 104 mmol/L (ref 98–110)
Creat: 1.19 mg/dL — ABNORMAL HIGH (ref 0.50–1.05)
Globulin: 2.5 g/dL (calc) (ref 1.9–3.7)
Glucose, Bld: 81 mg/dL (ref 65–99)
Potassium: 4.1 mmol/L (ref 3.5–5.3)
Sodium: 141 mmol/L (ref 135–146)
Total Bilirubin: 0.5 mg/dL (ref 0.2–1.2)
Total Protein: 6.9 g/dL (ref 6.1–8.1)

## 2018-04-06 LAB — CBC WITH DIFFERENTIAL/PLATELET
Absolute Monocytes: 590 cells/uL (ref 200–950)
BASOS ABS: 43 {cells}/uL (ref 0–200)
Basophils Relative: 0.6 %
Eosinophils Absolute: 454 cells/uL (ref 15–500)
Eosinophils Relative: 6.3 %
HCT: 39 % (ref 35.0–45.0)
HEMOGLOBIN: 12.9 g/dL (ref 11.7–15.5)
Lymphs Abs: 2455 cells/uL (ref 850–3900)
MCH: 30.4 pg (ref 27.0–33.0)
MCHC: 33.1 g/dL (ref 32.0–36.0)
MCV: 91.8 fL (ref 80.0–100.0)
MPV: 9.7 fL (ref 7.5–12.5)
Monocytes Relative: 8.2 %
NEUTROS ABS: 3658 {cells}/uL (ref 1500–7800)
Neutrophils Relative %: 50.8 %
Platelets: 275 10*3/uL (ref 140–400)
RBC: 4.25 10*6/uL (ref 3.80–5.10)
RDW: 13.5 % (ref 11.0–15.0)
Total Lymphocyte: 34.1 %
WBC: 7.2 10*3/uL (ref 3.8–10.8)

## 2018-04-06 MED ORDER — BENZONATATE 200 MG PO CAPS: 200.0000 mg | ORAL_CAPSULE | Freq: Three times a day (TID) | ORAL | 0 refills | Status: DC | PRN

## 2018-04-06 MED ORDER — PREDNISONE 20 MG PO TABS: 40.0000 mg | ORAL_TABLET | Freq: Every day | ORAL | 0 refills | Status: DC

## 2018-04-06 MED ORDER — ALBUTEROL SULFATE HFA 108 (90 BASE) MCG/ACT IN AERS: 2.0000 | INHALATION_SPRAY | RESPIRATORY_TRACT | 0 refills | Status: DC | PRN

## 2018-04-06 MED ORDER — GUAIFENESIN-CODEINE 100-10 MG/5ML PO SOLN: 5.0000 mL | Freq: Four times a day (QID) | ORAL | 0 refills | Status: DC | PRN

## 2018-04-06 NOTE — Patient Instructions (Addendum)
Schedule a physical- fasting labs will be needed  Shingles shot sent to pharmacy  Take steroids Take cough medicine  Schedule your mammogram

## 2018-04-06 NOTE — Assessment & Plan Note (Signed)
Controlled on HCTZ, obtain renal function and CBC Check lipids at CPE when she is fasting

## 2018-04-06 NOTE — Assessment & Plan Note (Signed)
She is doing a weight loss challenge with her family  Cutting out carbs, advised to increase water and veggies

## 2018-04-06 NOTE — Progress Notes (Signed)
   Subjective:    Patient ID: Haley Mcgrath, female    DOB: [birth year], 58 y.o.   MRN: [id number]  Patient presents for Illness (x1 week- nonproductive cough, nasal drainage )  Lat visit with me in Oct 2018   Cough mostly non productive, nasal drainage,  Wheezing has underlying allergies and Asthma. Cough makes her nauseated  No fever, no vomiting, no diarrhea  Non smoker  She used some left over codiene cough medicine which helped   Does not have any inhalers at home  Taking zyrtec   HTN-taking HCTZ  Hyperlipidemia- taking zocor at bedtime    Was in UC with bronchitis back in Oct  She has separated from husband and moved into her own place, this occurred last year, she feels she is in a better place   Review Of Systems:  GEN- denies fatigue, fever, weight loss,weakness, recent illness HEENT- denies eye drainage, change in vision, +nasal discharge, CVS- denies chest pain, palpitations RESP- denies SOB, +cough, +wheeze ABD- denies N/V, change in stools, abd pain GU- denies dysuria, hematuria, dribbling, incontinence MSK- denies joint pain, muscle aches, injury Neuro- denies headache, dizziness, syncope, seizure activity       Objective:    BP 132/62   Pulse 86   Temp 97.9 F (36.6 C) (Oral)   Resp 14   Ht 5\' 9"  (1.753 m)   Wt 275 lb (124.7 kg)   SpO2 98%   BMI 40.61 kg/m  GEN- NAD, alert and oriented x3 HEENT- PERRL, EOMI, non injected sclera, pink conjunctiva, MMM, oropharynx clear , TM clear bilat no effusion,  No  maxillary sinus tenderness, clear   Nasal drainage  Neck- Supple, no LAD CVS- RRR, no murmur RESP-CTAB, no wheeze  EXT- No edema Pulses- Radial 2+          Assessment & Plan:      Problem List Items Addressed This Visit      Unprioritized   Essential hypertension, benign    Controlled on HCTZ, obtain renal function and CBC Check lipids at CPE when she is fasting      Relevant Orders   CBC with Differential/Platelet   Comprehensive  metabolic panel   Hyperlipidemia - Primary   Morbid obesity (HCC)    She is doing a weight loss challenge with her family  Cutting out carbs, advised to increase water and veggies       Other Visit Diagnoses    Viral URI       Viral URi now triggering her asthma symptoms, no red flags, no fever, no travel, Prednisne, allergy meds, albuterol, tessalon during day, robitussin AC bedtime   Mild asthma with exacerbation, unspecified whether persistent       Relevant Medications   albuterol (PROVENTIL HFA;VENTOLIN HFA) 108 (90 Base) MCG/ACT inhaler   predniSONE (DELTASONE) 20 MG tablet      Note: This dictation was prepared with Dragon dictation along with smaller phrase technology. Any transcriptional errors that result from this process are unintentional.

## 2018-04-14 DIAGNOSIS — R52 Pain, unspecified: Secondary | ICD-10-CM | POA: Diagnosis not present

## 2018-04-14 DIAGNOSIS — J0100 Acute maxillary sinusitis, unspecified: Secondary | ICD-10-CM | POA: Diagnosis not present

## 2018-04-14 DIAGNOSIS — Z6841 Body Mass Index (BMI) 40.0 and over, adult: Secondary | ICD-10-CM | POA: Diagnosis not present

## 2018-05-11 ENCOUNTER — Other Ambulatory Visit: Payer: Self-pay | Admitting: Family Medicine

## 2018-05-16 ENCOUNTER — Telehealth: Payer: Self-pay | Admitting: *Deleted
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
## 2018-05-16 NOTE — Telephone Encounter (Signed)
Call placed to patient and patient made aware.   Appointment scheduled.  

## 2018-05-16 NOTE — Telephone Encounter (Signed)
Okay to do phone visit for tomorrow

## 2018-05-17 ENCOUNTER — Other Ambulatory Visit: Payer: Self-pay

## 2018-05-17 ENCOUNTER — Encounter: Payer: Self-pay | Admitting: Family Medicine

## 2018-05-17 ENCOUNTER — Ambulatory Visit (INDEPENDENT_AMBULATORY_CARE_PROVIDER_SITE_OTHER): Payer: BLUE CROSS/BLUE SHIELD | Admitting: Family Medicine

## 2018-05-17 DIAGNOSIS — G5601 Carpal tunnel syndrome, right upper limb: Secondary | ICD-10-CM

## 2018-05-17 DIAGNOSIS — M545 Low back pain, unspecified: Secondary | ICD-10-CM

## 2018-05-17 MED ORDER — CYCLOBENZAPRINE HCL 10 MG PO TABS: 10.0000 mg | ORAL_TABLET | Freq: Three times a day (TID) | ORAL | 1 refills | Status: DC | PRN

## 2018-05-17 MED ORDER — IBUPROFEN 800 MG PO TABS: 800.0000 mg | ORAL_TABLET | Freq: Three times a day (TID) | ORAL | 1 refills | Status: DC | PRN
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
## 2018-05-23 ENCOUNTER — Ambulatory Visit (INDEPENDENT_AMBULATORY_CARE_PROVIDER_SITE_OTHER): Payer: BLUE CROSS/BLUE SHIELD | Admitting: Family Medicine

## 2018-05-23 ENCOUNTER — Encounter: Payer: Self-pay | Admitting: Family Medicine

## 2018-05-23 ENCOUNTER — Other Ambulatory Visit: Payer: Self-pay

## 2018-05-23 DIAGNOSIS — J4540 Moderate persistent asthma, uncomplicated: Secondary | ICD-10-CM | POA: Diagnosis not present

## 2018-05-23 DIAGNOSIS — J302 Other seasonal allergic rhinitis: Secondary | ICD-10-CM

## 2018-05-23 MED ORDER — PREDNISONE 20 MG PO TABS: 40.0000 mg | ORAL_TABLET | Freq: Every day | ORAL | 0 refills | Status: DC

## 2018-05-23 MED ORDER — FLUTICASONE-SALMETEROL 250-50 MCG/DOSE IN AEPB: 1.0000 | INHALATION_SPRAY | Freq: Two times a day (BID) | RESPIRATORY_TRACT | 3 refills | Status: DC

## 2018-05-23 MED ORDER — FLUTICASONE PROPIONATE 50 MCG/ACT NA SUSP: 2.0000 | Freq: Every day | NASAL | 6 refills | Status: DC

## 2018-05-23 MED ORDER — BENZONATATE 100 MG PO CAPS: 100.0000 mg | ORAL_CAPSULE | Freq: Three times a day (TID) | ORAL | 0 refills | Status: DC | PRN

## 2018-05-23 NOTE — Progress Notes (Signed)
Virtual Visit via Telephone Note  I connected with Haley Mcgrath on 05/23/18 at 11:34 by telephone and verified that I am speaking with the correct person using two identifiers.   Pt location:at home  Physician location: at home, Milinda Antis MD  On call- patient and physician          I discussed the limitations, risks, security and privacy concerns of performing an evaluation and management service by telephone and the availability of in person appointments. I also discussed with the patient that there may be a patient responsible charge related to this service. The patient expressed understanding and agreed to proceed.   History of Present Illness: Pt called in past few days, has restarted with her dry hacky cough, had wheezing that started last night, no fever, no SOB. Has some sinus/allergy drainage, using her allergy pill. Used albuterol which helped.  Out of tessalon perrles She has had 3 exacerbations over past 2 months   She is at home, not working for now   [employer]   Observations/Objective: Normal WOB over phone, no audible wheeze, speaking in full sentences   Assessment and Plan: Asthma- start prednisone burst, will plan to start preventative inhaler Advair, has had multiple exacerbations some treated at [facility] as well , continue albuterol prn, tessalon refilled  Seasonal allergies- add flonase to zyrtec  Follow Up Instructions:    I discussed the assessment and treatment plan with the patient. The patient was provided an opportunity to ask questions and all were answered. The patient agreed with the plan and demonstrated an understanding of the instructions.   The patient was advised to call back or seek an in-person evaluation if the symptoms worsen or if the condition fails to improve as anticipated.  I provided 7 minutes of non-face-to-face time during this encounter.  END TIME 11:41am     Milinda Antis, MD

## 2018-06-06 ENCOUNTER — Emergency Department (HOSPITAL_BASED_OUTPATIENT_CLINIC_OR_DEPARTMENT_OTHER)
Admission: EM | Admit: 2018-06-06 | Discharge: 2018-06-06 | Disposition: A | Discharge status: Home / Self Care | Payer: BLUE CROSS/BLUE SHIELD | Attending: Emergency Medicine | Admitting: Emergency Medicine

## 2018-06-06 ENCOUNTER — Other Ambulatory Visit: Payer: Self-pay

## 2018-06-06 ENCOUNTER — Encounter (HOSPITAL_BASED_OUTPATIENT_CLINIC_OR_DEPARTMENT_OTHER): Payer: Self-pay

## 2018-06-06 DIAGNOSIS — Z79899 Other long term (current) drug therapy: Secondary | ICD-10-CM | POA: Diagnosis not present

## 2018-06-06 DIAGNOSIS — J45909 Unspecified asthma, uncomplicated: Secondary | ICD-10-CM | POA: Insufficient documentation

## 2018-06-06 DIAGNOSIS — M545 Low back pain: Secondary | ICD-10-CM | POA: Diagnosis not present

## 2018-06-06 DIAGNOSIS — M549 Dorsalgia, unspecified: Secondary | ICD-10-CM | POA: Diagnosis not present

## 2018-06-06 DIAGNOSIS — I10 Essential (primary) hypertension: Secondary | ICD-10-CM | POA: Diagnosis not present
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
## 2018-06-06 NOTE — ED Notes (Signed)
ED Provider at bedside. 

## 2018-06-06 NOTE — Discharge Instructions (Addendum)

## 2018-06-15 ENCOUNTER — Encounter: Payer: Self-pay | Admitting: Family Medicine

## 2018-06-24 ENCOUNTER — Ambulatory Visit (INDEPENDENT_AMBULATORY_CARE_PROVIDER_SITE_OTHER): Payer: BLUE CROSS/BLUE SHIELD | Admitting: Family Medicine

## 2018-06-24 ENCOUNTER — Other Ambulatory Visit: Payer: Self-pay

## 2018-06-24 VITALS — BP 130/78 | HR 64 | Temp 98.4°F | Resp 18 | Ht 67.0 in | Wt 281.8 lb

## 2018-06-24 DIAGNOSIS — Z0001 Encounter for general adult medical examination with abnormal findings: Secondary | ICD-10-CM

## 2018-06-24 DIAGNOSIS — E782 Mixed hyperlipidemia: Secondary | ICD-10-CM | POA: Diagnosis not present

## 2018-06-24 DIAGNOSIS — J4540 Moderate persistent asthma, uncomplicated: Secondary | ICD-10-CM

## 2018-06-24 DIAGNOSIS — Z0000 Encounter for general adult medical examination without abnormal findings: Secondary | ICD-10-CM | POA: Diagnosis not present

## 2018-06-24 DIAGNOSIS — Z114 Encounter for screening for human immunodeficiency virus [HIV]: Secondary | ICD-10-CM | POA: Diagnosis not present

## 2018-06-24 DIAGNOSIS — I10 Essential (primary) hypertension: Secondary | ICD-10-CM | POA: Diagnosis not present

## 2018-06-24 DIAGNOSIS — Z1239 Encounter for other screening for malignant neoplasm of breast: Secondary | ICD-10-CM

## 2018-06-24 LAB — CBC WITH DIFFERENTIAL/PLATELET
Absolute Monocytes: 421 cells/uL (ref 200–950)
Basophils Absolute: 38 cells/uL (ref 0–200)
Basophils Relative: 0.7 %
Eosinophils Absolute: 378 cells/uL (ref 15–500)
Eosinophils Relative: 7 %
HCT: 39.5 % (ref 35.0–45.0)
Hemoglobin: 13.3 g/dL (ref 11.7–15.5)
Lymphs Abs: 2068 cells/uL (ref 850–3900)
MCH: 30.4 pg (ref 27.0–33.0)
MCHC: 33.7 g/dL (ref 32.0–36.0)
MCV: 90.4 fL (ref 80.0–100.0)
MPV: 9.6 fL (ref 7.5–12.5)
Monocytes Relative: 7.8 %
Neutro Abs: 2495 cells/uL (ref 1500–7800)
Neutrophils Relative %: 46.2 %
Platelets: 315 10*3/uL (ref 140–400)
RBC: 4.37 10*6/uL (ref 3.80–5.10)
RDW: 14.2 % (ref 11.0–15.0)
Total Lymphocyte: 38.3 %
WBC: 5.4 10*3/uL (ref 3.8–10.8)

## 2018-06-24 NOTE — Patient Instructions (Signed)
Schedule Mammogram in June  Shingrix sent to pharmacy  We will call with lab results F/U 4 months
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
## 2018-06-26 ENCOUNTER — Encounter: Payer: Self-pay | Admitting: Family Medicine

## 2018-06-26 NOTE — Assessment & Plan Note (Signed)
Overall doing much better, continued on inhalers

## 2018-06-26 NOTE — Assessment & Plan Note (Signed)
Controlled no changes 

## 2018-06-26 NOTE — Assessment & Plan Note (Signed)
Discussed dietary changes.

## 2018-06-28 ENCOUNTER — Other Ambulatory Visit: Payer: Self-pay

## 2018-06-28 LAB — COMPREHENSIVE METABOLIC PANEL
AG Ratio: 1.6 (calc) (ref 1.0–2.5)
ALT: 35 U/L — ABNORMAL HIGH (ref 6–29)
AST: 39 U/L — ABNORMAL HIGH (ref 10–35)
Albumin: 4.3 g/dL (ref 3.6–5.1)
Alkaline phosphatase (APISO): 120 U/L (ref 37–153)
BUN: 17 mg/dL (ref 7–25)
CO2: 27 mmol/L (ref 20–32)
Calcium: 9.9 mg/dL (ref 8.6–10.4)
Chloride: 101 mmol/L (ref 98–110)
Creat: 0.89 mg/dL (ref 0.50–1.05)
Globulin: 2.7 g/dL (calc) (ref 1.9–3.7)
Glucose, Bld: 90 mg/dL (ref 65–99)
Potassium: 4.2 mmol/L (ref 3.5–5.3)
Sodium: 139 mmol/L (ref 135–146)
Total Bilirubin: 0.4 mg/dL (ref 0.2–1.2)
Total Protein: 7 g/dL (ref 6.1–8.1)

## 2018-06-28 LAB — LIPID PANEL
Cholesterol: 165 mg/dL (ref <200)
HDL: 58 mg/dL (ref >=50)
LDL Cholesterol (Calc): 86 mg/dL (calc)
Non-HDL Cholesterol (Calc): 107 mg/dL (calc) (ref <130)
Total CHOL/HDL Ratio: 2.8 (calc) (ref <5.0)
Triglycerides: 117 mg/dL (ref <150)

## 2018-06-28 LAB — HIV ANTIBODY (ROUTINE TESTING W REFLEX): HIV 1&2 Ab, 4th Generation: NONREACTIVE

## 2018-06-28 MED ORDER — ZOSTER VAC RECOMB ADJUVANTED 50 MCG/0.5ML IM SUSR: 0.5000 mL | Freq: Once | INTRAMUSCULAR | 0 refills | Status: AC

## 2018-07-08 ENCOUNTER — Other Ambulatory Visit: Payer: Self-pay

## 2018-07-08 ENCOUNTER — Ambulatory Visit (HOSPITAL_COMMUNITY)
Admission: RE | Admit: 2018-07-08 | Discharge: 2018-07-08 | Disposition: A | Discharge status: Home / Self Care | Payer: BC Managed Care – PPO | Source: Ambulatory Visit | Attending: Family Medicine | Admitting: Family Medicine

## 2018-07-08 DIAGNOSIS — Z1231 Encounter for screening mammogram for malignant neoplasm of breast: Secondary | ICD-10-CM | POA: Insufficient documentation

## 2018-07-08 DIAGNOSIS — Z1239 Encounter for other screening for malignant neoplasm of breast: Secondary | ICD-10-CM

## 2018-07-12 ENCOUNTER — Telehealth: Payer: Self-pay | Admitting: *Deleted

## 2018-07-12 MED ORDER — ZOSTER VAC RECOMB ADJUVANTED 50 MCG/0.5ML IM SUSR: 0.5000 mL | Freq: Once | INTRAMUSCULAR | 1 refills | Status: AC

## 2018-07-12 NOTE — Telephone Encounter (Signed)
-----   Message from Alycia Rossetti, MD sent at 06/26/2018  8:21 PM EDT ----- Regarding: Send Shingrix to her pharamcy

## 2018-07-28 ENCOUNTER — Telehealth: Payer: Self-pay | Admitting: *Deleted

## 2018-07-28 NOTE — Telephone Encounter (Signed)
As along asswelling resolves, she can wait until the 30th, to be seen If any signs of infection needs to be seen urgently or if swelling wont go down

## 2018-07-28 NOTE — Telephone Encounter (Signed)
Call placed to patient and patient made aware.   Reports that swelling does resolve Q night. No infection noted.

## 2018-07-28 NOTE — Telephone Encounter (Signed)
Received call from patient.   Reports that she had a "foot detox" last week and since then she has been having increased edema in B ankles and feet. States that swelling will go down at night once elevated, but returns when she is up and moving around during the day. Reports that she thinks she had a localized reaction to a lotion that was used at the spa, but irritation has since resolved.   Advised that appointment required to evaluate. Offered appointment with PA and fellow MD, but patient requested to wait until she can be seen by PCP.   Appointment scheduled for 08/02/2018.

## 2018-08-02 ENCOUNTER — Other Ambulatory Visit: Payer: Self-pay

## 2018-08-02 ENCOUNTER — Encounter: Payer: Self-pay | Admitting: Family Medicine

## 2018-08-02 ENCOUNTER — Ambulatory Visit (INDEPENDENT_AMBULATORY_CARE_PROVIDER_SITE_OTHER): Payer: BC Managed Care – PPO | Admitting: Family Medicine

## 2018-08-02 VITALS — BP 124/78 | HR 80 | Temp 98.6°F | Resp 14 | Ht 67.0 in | Wt 288.0 lb

## 2018-08-02 DIAGNOSIS — R609 Edema, unspecified: Secondary | ICD-10-CM | POA: Diagnosis not present

## 2018-08-02 NOTE — Patient Instructions (Addendum)
Keep legs elevated We will call with lab results  F/u PENDING RESULTS

## 2018-08-02 NOTE — Progress Notes (Signed)
   Subjective:    Patient ID: Haley Mcgrath, female    DOB: [birth year], 58 y.o.   MRN: [id number]  Patient presents for BLE Edema (x1 week- L>R)  Pt had a foot detox at a local salon about 1 month ago, since then has had swelling on and off of feet. When she props them up it resolves and at night it goes down, during the day has a lot of swelling L >R No pain in feet, no redness no drainage   She is taking HCTZ for BP   Review Of Systems:  GEN- denies fatigue, fever, weight loss,weakness, recent illness HEENT- denies eye drainage, change in vision, nasal discharge, CVS- denies chest pain, palpitations RESP- denies SOB, cough, wheeze ABD- denies N/V, change in stools, abd pain GU- denies dysuria, hematuria, dribbling, incontinence MSK- denies joint pain, muscle aches, injury Neuro- denies headache, dizziness, syncope, seizure activity       Objective:    BP 124/78   Pulse 80   Temp 98.6 F (37 C) (Oral)   Resp 14   Ht 5\' 7"  (1.702 m)   Wt 288 lb (130.6 kg)   SpO2 98%   BMI 45.11 kg/m  GEN- NAD, alert and oriented x3 CVS- RRR, no murmur RESP-CTAB ABD-NABS,soft,NT,ND EXT- bilat non pitting  Edema to ankles, NT, FROM ankles/foot Pulses- Radial, DP- 2+        Assessment & Plan:      Problem List Items Addressed This Visit    None    Visit Diagnoses    Peripheral edema    -  Primary   Obtain labs, she showed by pics of her detox, water was brown with debri at end, unclear what was used. or this may be coincidence, no pain or sign of infection   Relevant Orders   Comprehensive metabolic panel (Completed)   CBC with Differential/Platelet (Completed)   Magnesium (Completed)   Uric Acid (Completed)   C-reactive protein (Completed)   Sedimentation Rate (Completed)      Note: This dictation was prepared with Dragon dictation along with smaller phrase technology. Any transcriptional errors that result from this process are unintentional.

## 2018-08-03 LAB — CBC WITH DIFFERENTIAL/PLATELET
Absolute Monocytes: 479 cells/uL (ref 200–950)
Basophils Absolute: 38 cells/uL (ref 0–200)
Basophils Relative: 0.6 %
Eosinophils Absolute: 391 cells/uL (ref 15–500)
Eosinophils Relative: 6.2 %
HCT: 38.3 % (ref 35.0–45.0)
Hemoglobin: 12.7 g/dL (ref 11.7–15.5)
Lymphs Abs: 2388 cells/uL (ref 850–3900)
MCH: 29.8 pg (ref 27.0–33.0)
MCHC: 33.2 g/dL (ref 32.0–36.0)
MCV: 89.9 fL (ref 80.0–100.0)
MPV: 9.9 fL (ref 7.5–12.5)
Monocytes Relative: 7.6 %
Neutro Abs: 3005 cells/uL (ref 1500–7800)
Neutrophils Relative %: 47.7 %
Platelets: 297 10*3/uL (ref 140–400)
RBC: 4.26 10*6/uL (ref 3.80–5.10)
RDW: 14 % (ref 11.0–15.0)
Total Lymphocyte: 37.9 %
WBC: 6.3 10*3/uL (ref 3.8–10.8)

## 2018-08-03 LAB — COMPREHENSIVE METABOLIC PANEL
AG Ratio: 1.8 (calc) (ref 1.0–2.5)
ALT: 42 U/L — ABNORMAL HIGH (ref 6–29)
AST: 54 U/L — ABNORMAL HIGH (ref 10–35)
Albumin: 4.4 g/dL (ref 3.6–5.1)
Alkaline phosphatase (APISO): 102 U/L (ref 37–153)
BUN: 15 mg/dL (ref 7–25)
CO2: 28 mmol/L (ref 20–32)
Calcium: 10 mg/dL (ref 8.6–10.4)
Chloride: 103 mmol/L (ref 98–110)
Creat: 1 mg/dL (ref 0.50–1.05)
Globulin: 2.5 g/dL (calc) (ref 1.9–3.7)
Glucose, Bld: 91 mg/dL (ref 65–99)
Potassium: 4.4 mmol/L (ref 3.5–5.3)
Sodium: 139 mmol/L (ref 135–146)
Total Bilirubin: 0.5 mg/dL (ref 0.2–1.2)
Total Protein: 6.9 g/dL (ref 6.1–8.1)

## 2018-08-03 LAB — SEDIMENTATION RATE: Sed Rate: 6 mm/h (ref 0–30)

## 2018-08-03 LAB — URIC ACID: Uric Acid, Serum: 7.4 mg/dL — ABNORMAL HIGH (ref 2.5–7.0)

## 2018-08-03 LAB — C-REACTIVE PROTEIN: CRP: 4.3 mg/L (ref <8.0)

## 2018-08-03 LAB — MAGNESIUM: Magnesium: 2.1 mg/dL (ref 1.5–2.5)

## 2018-08-04 ENCOUNTER — Other Ambulatory Visit: Payer: Self-pay | Admitting: *Deleted

## 2018-08-04 MED ORDER — FUROSEMIDE 20 MG PO TABS: 20.0000 mg | ORAL_TABLET | Freq: Every day | ORAL | 3 refills | Status: DC

## 2018-08-04 MED ORDER — INDOMETHACIN 50 MG PO CAPS: ORAL_CAPSULE | ORAL | 0 refills | Status: DC

## 2018-08-04 MED ORDER — ALLOPURINOL 100 MG PO TABS: 100.0000 mg | ORAL_TABLET | Freq: Every day | ORAL | 6 refills | Status: DC

## 2018-08-16 ENCOUNTER — Other Ambulatory Visit: Payer: Self-pay | Admitting: Family Medicine

## 2018-09-06 ENCOUNTER — Other Ambulatory Visit: Payer: Self-pay

## 2018-09-07 ENCOUNTER — Ambulatory Visit (INDEPENDENT_AMBULATORY_CARE_PROVIDER_SITE_OTHER): Payer: BC Managed Care – PPO | Admitting: Family Medicine

## 2018-09-07 ENCOUNTER — Encounter: Payer: Self-pay | Admitting: Family Medicine

## 2018-09-07 VITALS — BP 130/72 | HR 78 | Temp 97.9°F | Resp 14 | Ht 67.0 in | Wt 287.0 lb

## 2018-09-07 DIAGNOSIS — M109 Gout, unspecified: Secondary | ICD-10-CM | POA: Diagnosis not present

## 2018-09-07 DIAGNOSIS — I10 Essential (primary) hypertension: Secondary | ICD-10-CM

## 2018-09-07 DIAGNOSIS — K760 Fatty (change of) liver, not elsewhere classified: Secondary | ICD-10-CM

## 2018-09-07 NOTE — Patient Instructions (Signed)
F/U 6 months

## 2018-09-07 NOTE — Assessment & Plan Note (Signed)
Discussed dietary changes for weight loss Recent lipids normal

## 2018-09-07 NOTE — Progress Notes (Signed)
   Subjective:    Patient ID: Haley Mcgrath, female    DOB: 01-10-1961, 58 y.o.   MRN: [id number]  Patient presents for Follow-up (is fasting)  Patient here for interim follow-up on her peripheral edema and blood pressure. Significant edema in the lower extremities after she had a foot detox.  I obtain labs including inflammatory infectious labs however her uric acid was significantly elevated so she was started on allopurinol 100 mg once a day and also given Indocin to take for the flare.  I did D/C the hydrochlorothiazide as this can contribute to elevated uric acid she was started on Lasix 20 mg once a day in its place.  She is here today for recheck on all the above  No new concerns     Review Of Systems:  GEN- denies fatigue, fever, weight loss,weakness, recent illness HEENT- denies eye drainage, change in vision, nasal discharge, CVS- denies chest pain, palpitations RESP- denies SOB, cough, wheeze ABD- denies N/V, change in stools, abd pain GU- denies dysuria, hematuria, dribbling, incontinence MSK- denies joint pain, muscle aches, injury Neuro- denies headache, dizziness, syncope, seizure activity       Objective:    BP 130/72   Pulse 78   Temp 97.9 F (36.6 C) (Oral)   Resp 14   Ht 5\' 7"  (1.702 m)   Wt 287 lb (130.2 kg)   SpO2 98%   BMI 44.95 kg/m  GEN- NAD, alert and oriented x3 CVS- RRR, no murmur RESP-CTAB EXT- trace left lat ankle edema Pulses- Radial, DP- 2+        Assessment & Plan:      Problem List Items Addressed This Visit      Unprioritized   Essential hypertension, benign - Primary    Pressure is still controlled.  She will continue the Lasix 20 mg.  I am rechecking her renal function and her potassium      Relevant Orders   Comprehensive metabolic panel   Fatty liver    Discussed dietary changes for weight loss Recent lipids normal      Relevant Orders   Comprehensive metabolic panel   Gout    Continue allopurinol this may been a  combination with dietary as well as HCTZ the cause uric acid to go up.  Unclear how the detox fits into this but her swelling has resolved.      Relevant Orders   Uric Acid      Note: This dictation was prepared with Dragon dictation along with smaller phrase technology. Any transcriptional errors that result from this process are unintentional.

## 2018-09-07 NOTE — Assessment & Plan Note (Signed)
Pressure is still controlled.  She will continue the Lasix 20 mg.  I am rechecking her renal function and her potassium

## 2018-09-07 NOTE — Assessment & Plan Note (Signed)
Continue allopurinol this may been a combination with dietary as well as HCTZ the cause uric acid to go up.  Unclear how the detox fits into this but her swelling has resolved.

## 2018-09-08 LAB — COMPREHENSIVE METABOLIC PANEL
AG Ratio: 1.6 (calc) (ref 1.0–2.5)
ALT: 46 U/L — ABNORMAL HIGH (ref 6–29)
AST: 60 U/L — ABNORMAL HIGH (ref 10–35)
Albumin: 4.5 g/dL (ref 3.6–5.1)
Alkaline phosphatase (APISO): 87 U/L (ref 37–153)
BUN/Creatinine Ratio: 21 (calc) (ref 6–22)
BUN: 23 mg/dL (ref 7–25)
CO2: 26 mmol/L (ref 20–32)
Calcium: 10.1 mg/dL (ref 8.6–10.4)
Chloride: 107 mmol/L (ref 98–110)
Creat: 1.1 mg/dL — ABNORMAL HIGH (ref 0.50–1.05)
Globulin: 2.8 g/dL (calc) (ref 1.9–3.7)
Glucose, Bld: 91 mg/dL (ref 65–99)
Potassium: 4 mmol/L (ref 3.5–5.3)
Sodium: 141 mmol/L (ref 135–146)
Total Bilirubin: 0.5 mg/dL (ref 0.2–1.2)
Total Protein: 7.3 g/dL (ref 6.1–8.1)

## 2018-09-08 LAB — URIC ACID: Uric Acid, Serum: 6.1 mg/dL (ref 2.5–7.0)

## 2018-10-11 ENCOUNTER — Encounter: Payer: BLUE CROSS/BLUE SHIELD | Admitting: Family Medicine

## 2018-10-24 ENCOUNTER — Encounter: Payer: Self-pay | Admitting: Family Medicine

## 2018-10-24 ENCOUNTER — Ambulatory Visit (INDEPENDENT_AMBULATORY_CARE_PROVIDER_SITE_OTHER): Payer: BC Managed Care – PPO | Admitting: Family Medicine

## 2018-10-24 ENCOUNTER — Other Ambulatory Visit: Payer: Self-pay

## 2018-10-24 DIAGNOSIS — B349 Viral infection, unspecified: Secondary | ICD-10-CM | POA: Diagnosis not present

## 2018-10-24 DIAGNOSIS — R05 Cough: Secondary | ICD-10-CM

## 2018-10-24 DIAGNOSIS — J4541 Moderate persistent asthma with (acute) exacerbation: Secondary | ICD-10-CM

## 2018-10-24 MED ORDER — ALBUTEROL SULFATE (2.5 MG/3ML) 0.083% IN NEBU: 2.5000 mg | INHALATION_SOLUTION | Freq: Four times a day (QID) | RESPIRATORY_TRACT | 1 refills | Status: AC | PRN

## 2018-10-24 MED ORDER — BENZONATATE 100 MG PO CAPS: 100.0000 mg | ORAL_CAPSULE | Freq: Three times a day (TID) | ORAL | 0 refills | Status: DC | PRN

## 2018-10-24 MED ORDER — PREDNISONE 10 MG PO TABS: ORAL_TABLET | ORAL | 0 refills | Status: DC

## 2018-10-24 NOTE — Progress Notes (Signed)
Virtual Visit via Telephone Note  I connected with Haley Mcgrath on 10/24/18 at  11:42am  by telephone and verified that I am speaking with the correct person using two identifiers.       Pt location: at home   Physician location:  In office, [facility], Vic Blackbird MD     On call: patient and physician   I discussed the limitations, risks, security and privacy concerns of performing an evaluation and management service by telephone and the availability of in person appointments. I also discussed with the patient that there may be a patient responsible charge related to this service. The patient expressed understanding and agreed to proceed.   History of Present Illness:  Pt with telephone visit for cough and congestion in chest started last Thursday worse over the weekend. She has underlying asthma, she is on albuterol and advair. She has been wheezing a lot , but not SOB . No sinus pressure or headache, no drainage  No known COVID 19 exposure No vomiting or diarrhea   Took the robitussin codiene a few days which heped   She did work over the weekend , is off this week, she took vacation days    Observations/Objective: Congested sounding over phone, occ cough heard, no audible wheezing,seapking in full sentences   Assessment and Plan:  Asthma/bronchitis exacerbation- with most likley viral underlying  Albuterol nebulizer  Prednisone taper  Tessalon perrles  Prescribed Recommend she go get COVID-19 testing, she is higher risk due to asthma    Follow Up Instructions:    I discussed the assessment and treatment plan with the patient. The patient was provided an opportunity to ask questions and all were answered. The patient agreed with the plan and demonstrated an understanding of the instructions.   The patient was advised to call back or seek an in-person evaluation if the symptoms worsen or if the condition fails to improve as anticipated.  I provided 7  minutes of non-face-to-face time during this encounter. End time  11:49am   Vic Blackbird, MD

## 2018-10-25 ENCOUNTER — Other Ambulatory Visit: Payer: Self-pay

## 2018-10-25 DIAGNOSIS — R6889 Other general symptoms and signs: Secondary | ICD-10-CM | POA: Diagnosis not present

## 2018-10-25 DIAGNOSIS — Z20822 Contact with and (suspected) exposure to covid-19: Secondary | ICD-10-CM

## 2018-10-26 ENCOUNTER — Telehealth: Payer: Self-pay | Admitting: *Deleted

## 2018-10-26 LAB — NOVEL CORONAVIRUS, NAA: SARS-CoV-2, NAA: NOT DETECTED

## 2018-10-26 NOTE — Telephone Encounter (Signed)
completed

## 2018-10-26 NOTE — Telephone Encounter (Signed)
Received call from patient.   Reports that she picked up prescriptions from pharmacy and noted that Albuterol was the fluid for a nebulizer, but she does not have a machine.   States that she also received inhaler, but would like an order for the nebulizer. Advised that order would need to go through DME. Requested [employer].  Order placed on desk for signature.

## 2018-11-13 ENCOUNTER — Other Ambulatory Visit: Payer: Self-pay | Admitting: Family Medicine

## 2018-11-29 ENCOUNTER — Ambulatory Visit (INDEPENDENT_AMBULATORY_CARE_PROVIDER_SITE_OTHER): Payer: BC Managed Care – PPO | Admitting: Family Medicine

## 2018-11-29 ENCOUNTER — Other Ambulatory Visit: Payer: Self-pay

## 2018-11-29 ENCOUNTER — Encounter: Payer: Self-pay | Admitting: Family Medicine

## 2018-11-29 DIAGNOSIS — J302 Other seasonal allergic rhinitis: Secondary | ICD-10-CM | POA: Diagnosis not present

## 2018-11-29 DIAGNOSIS — J4540 Moderate persistent asthma, uncomplicated: Secondary | ICD-10-CM

## 2018-11-29 MED ORDER — FLUTICASONE PROPIONATE 50 MCG/ACT NA SUSP: 2.0000 | Freq: Every day | NASAL | 6 refills | Status: DC

## 2018-11-29 MED ORDER — MONTELUKAST SODIUM 10 MG PO TABS: 10.0000 mg | ORAL_TABLET | Freq: Every day | ORAL | 6 refills | Status: DC

## 2018-11-29 MED ORDER — BENZONATATE 100 MG PO CAPS: 100.0000 mg | ORAL_CAPSULE | Freq: Three times a day (TID) | ORAL | 0 refills | Status: DC | PRN

## 2018-11-29 NOTE — Progress Notes (Signed)
Virtual Visit via Telephone Note  I connected with Haley Mcgrath on 11/29/18 at 11:11am by telephone and verified that I am speaking with the correct person using two identifiers.     Pt location: at home   Physician location:  In office, [facility], Vic Blackbird MD     On call: patient and physician   I discussed the limitations, risks, security and privacy concerns of performing an evaluation and management service by telephone and the availability of in person appointments. I also discussed with the patient that there may be a patient responsible charge related to this service. The patient expressed understanding and agreed to proceed.   History of Present Illness: Pt with recurrent cough for the past 4-5 days, she has been using nebulizer since yesterday, she has been using cough medicine with codiene at bedtime . Dry cough, minimal wheeze, no fever, no GI symptoms,no body aches   Has not been using advair   She has not used flonase, she has been using zyrtec  Tessalon perrles they worked out of these   No difficulty breathing   Observations/Objective: NAD noted on phone, mild hoarse voice, no cough or wheeze heard, speaking in full sentences   Assessment and Plan: Asthma- cough variant , no current bronchospasm, will have her restart her preventative inhaler Advair uses twice a day.  I will refill the [employer] she can use this during the day.  Her Zyrtec is not working as well we will try her on Singulair for allergy asthma symptoms she can also keep the Flonase for any nasal drainage postnasal drip.  I did discuss use of nebulizer,  She also has a form her daughter needs completed since she lives with her daughter.  She is high risk for complications of VZCHY-85 in the setting of her asthma and hypertension.  They needs a form completed so that her daughter may work from home decreased probability of bringing home Covid virus from her workplace. I will complete  this form   Follow Up Instructions:    I discussed the assessment and treatment plan with the patient. The patient was provided an opportunity to ask questions and all were answered. The patient agreed with the plan and demonstrated an understanding of the instructions.   The patient was advised to call back or seek an in-person evaluation if the symptoms worsen or if the condition fails to improve as anticipated.  I provided 5 minutes of non-face-to-face time during this encounter. End time 11:16am  Vic Blackbird, MD

## 2018-12-04 ENCOUNTER — Other Ambulatory Visit: Payer: Self-pay | Admitting: Family Medicine

## 2018-12-06 ENCOUNTER — Other Ambulatory Visit: Payer: Self-pay

## 2018-12-06 DIAGNOSIS — Z20822 Contact with and (suspected) exposure to covid-19: Secondary | ICD-10-CM

## 2018-12-07 LAB — NOVEL CORONAVIRUS, NAA: SARS-CoV-2, NAA: NOT DETECTED

## 2018-12-30 DIAGNOSIS — Z6841 Body Mass Index (BMI) 40.0 and over, adult: Secondary | ICD-10-CM | POA: Diagnosis not present

## 2018-12-30 DIAGNOSIS — J40 Bronchitis, not specified as acute or chronic: Secondary | ICD-10-CM | POA: Diagnosis not present

## 2018-12-30 DIAGNOSIS — J0100 Acute maxillary sinusitis, unspecified: Secondary | ICD-10-CM | POA: Diagnosis not present

## 2018-12-30 DIAGNOSIS — R05 Cough: Secondary | ICD-10-CM | POA: Diagnosis not present

## 2019-01-18 ENCOUNTER — Encounter: Payer: Self-pay | Admitting: Family Medicine

## 2019-02-11 ENCOUNTER — Other Ambulatory Visit: Payer: Self-pay | Admitting: Family Medicine

## 2019-02-20 ENCOUNTER — Encounter: Payer: Self-pay | Admitting: Family Medicine

## 2019-03-04 ENCOUNTER — Other Ambulatory Visit: Payer: Self-pay | Admitting: Family Medicine

## 2019-03-07 ENCOUNTER — Encounter: Payer: Self-pay | Admitting: Family Medicine

## 2019-03-19 ENCOUNTER — Other Ambulatory Visit: Payer: Self-pay | Admitting: Family Medicine

## 2019-03-26 DIAGNOSIS — Z6841 Body Mass Index (BMI) 40.0 and over, adult: Secondary | ICD-10-CM | POA: Diagnosis not present

## 2019-03-26 DIAGNOSIS — R05 Cough: Secondary | ICD-10-CM | POA: Diagnosis not present

## 2019-03-26 DIAGNOSIS — J441 Chronic obstructive pulmonary disease with (acute) exacerbation: Secondary | ICD-10-CM | POA: Diagnosis not present

## 2019-03-31 ENCOUNTER — Other Ambulatory Visit: Payer: Self-pay | Admitting: Family Medicine

## 2019-04-09 ENCOUNTER — Other Ambulatory Visit: Payer: Self-pay | Admitting: Family Medicine

## 2019-04-17 ENCOUNTER — Telehealth: Payer: Self-pay | Admitting: *Deleted

## 2019-04-17 NOTE — Telephone Encounter (Signed)
Inquired as to if the COVID Vaccine is appropriate for patient.    Advised that it is the belief of [facility] that all patient's would benefit from the vaccine. Advised that there are no interactions/ contraindications noted with vaccination at this time.

## 2019-04-27 ENCOUNTER — Other Ambulatory Visit: Payer: Self-pay | Admitting: Family Medicine

## 2019-05-11 ENCOUNTER — Other Ambulatory Visit: Payer: Self-pay | Admitting: Family Medicine

## 2019-05-20 ENCOUNTER — Other Ambulatory Visit: Payer: Self-pay | Admitting: Family Medicine

## 2019-06-09 ENCOUNTER — Other Ambulatory Visit: Payer: Self-pay | Admitting: Family Medicine

## 2019-06-27 ENCOUNTER — Other Ambulatory Visit: Payer: Self-pay | Admitting: Family Medicine

## 2019-06-29 ENCOUNTER — Other Ambulatory Visit: Payer: Self-pay | Admitting: Family Medicine

## 2019-07-25 ENCOUNTER — Encounter: Payer: Self-pay | Admitting: Family Medicine

## 2019-07-25 ENCOUNTER — Ambulatory Visit (HOSPITAL_COMMUNITY)
Admission: RE | Admit: 2019-07-25 | Discharge: 2019-07-25 | Disposition: A | Discharge status: Home / Self Care | Payer: BC Managed Care – PPO | Source: Ambulatory Visit | Attending: Family Medicine | Admitting: Family Medicine

## 2019-07-25 ENCOUNTER — Other Ambulatory Visit: Payer: Self-pay

## 2019-07-25 ENCOUNTER — Ambulatory Visit (INDEPENDENT_AMBULATORY_CARE_PROVIDER_SITE_OTHER): Payer: BC Managed Care – PPO | Admitting: Family Medicine

## 2019-07-25 VITALS — BP 140/78 | HR 80 | Temp 98.6°F | Resp 14 | Ht 67.0 in | Wt 267.0 lb

## 2019-07-25 DIAGNOSIS — R109 Unspecified abdominal pain: Secondary | ICD-10-CM | POA: Diagnosis not present

## 2019-07-25 DIAGNOSIS — G5603 Carpal tunnel syndrome, bilateral upper limbs: Secondary | ICD-10-CM

## 2019-07-25 DIAGNOSIS — M546 Pain in thoracic spine: Secondary | ICD-10-CM | POA: Diagnosis not present

## 2019-07-25 DIAGNOSIS — R3129 Other microscopic hematuria: Secondary | ICD-10-CM | POA: Diagnosis not present

## 2019-07-25 DIAGNOSIS — K5790 Diverticulosis of intestine, part unspecified, without perforation or abscess without bleeding: Secondary | ICD-10-CM | POA: Diagnosis not present

## 2019-07-25 LAB — URINALYSIS, ROUTINE W REFLEX MICROSCOPIC
Bacteria, UA: NONE SEEN /HPF
Bilirubin Urine: NEGATIVE
Glucose, UA: NEGATIVE
Hyaline Cast: NONE SEEN /LPF
Ketones, ur: NEGATIVE
Leukocytes,Ua: NEGATIVE
Nitrite: NEGATIVE
Protein, ur: NEGATIVE
Specific Gravity, Urine: 1.025 (ref 1.001–1.03)
WBC, UA: NONE SEEN /HPF (ref 0–5)
pH: 5.5 (ref 5.0–8.0)

## 2019-07-25 LAB — MICROSCOPIC MESSAGE

## 2019-07-25 MED ORDER — HYDROCODONE-ACETAMINOPHEN 5-325 MG PO TABS: 1.0000 | ORAL_TABLET | Freq: Four times a day (QID) | ORAL | 0 refills | Status: DC | PRN

## 2019-07-25 NOTE — Patient Instructions (Addendum)
F/U 2-3 months for Physical  Stop the ibuprofen Take the hydrocodone for now  CT scan Is being set up

## 2019-07-25 NOTE — Progress Notes (Signed)
   Subjective:    Patient ID: Haley Mcgrath, female    DOB: 1960-03-13, 59 y.o.   MRN: [id number]  Patient presents for R Sided Pain (x 2 weeks- no injury- pain in mid back on R side that radiates down and aroud- decrease ROM)      Pt right sided back pain that radiates to her lower abd at times for the past 2 weeks. No injury but  getting progressively worse. If she turns a certain it will increase the pain.   She does a lot of overhead lifting work and pushing and pulling repetitively with boxes.  No pain or pain with urination , no change in bowel movements  She took ibuprofen which helps for a little while  She tried flexeril but minimal improvement  She has had some right knee pain as well not sure if it was related after walking a lot at work    She would also like to proceed with next steps for her bilat hand numbness and tingling, the left is worse than the right     Review Of Systems:  GEN- denies fatigue, fever, weight loss,weakness, recent illness HEENT- denies eye drainage, change in vision, nasal discharge, CVS- denies chest pain, palpitations RESP- denies SOB, cough, wheeze ABD- denies N/V, change in stools, abd pain GU- denies dysuria, hematuria, dribbling, incontinence MSK- + joint pain, muscle aches, injury Neuro- denies headache, dizziness, syncope, seizure activity       Objective:    BP 140/78   Pulse 80   Temp 98.6 F (37 C) (Temporal)   Resp 14   Ht 5\' 7"  (1.702 m)   Wt 267 lb (121.1 kg)   SpO2 96%   BMI 41.82 kg/m  GEN- NAD, alert and oriented x3 HEENT- PERRL, EOMI, non injected sclera CVS- RRR, no murmur RESP-CTAB ABD-NABS,soft, +RIGHT CVA tenderness, mild TTP suprapubic region, no guarding, ND  MSK- mild TTP thoracic and lumbar spine, fair ROM, fair rom hips/knees, NEG slr  Skin in tact  EXT- No edema Pulses- Radial, DP- 2+        Assessment & Plan:      Problem List Items Addressed This Visit    None    Visit Diagnoses    Right  flank pain    -  Primary   Pt with flank pain and back pain, UA shows blood, with radiation to bladder region, concern for stone, obtain CT renal to evaluate, norco given for pain Add flomax if stone present D/C NSAIDS due to hematuria   Relevant Orders   CT RENAL STONE STUDY   Acute right-sided thoracic back pain       Relevant Medications   HYDROcodone-acetaminophen (NORCO) 5-325 MG tablet   Other Relevant Orders   Urinalysis, Routine w reflex microscopic (Completed)   Microscopic hematuria       Relevant Orders   CT RENAL STONE STUDY   Carpal tunnel syndrome, bilateral       Pt mentioned proceeding with work up for hands, they are getting worse, has tried braces/NSAIDS   Relevant Orders   Ambulatory referral to Neurology      Note: This dictation was prepared with Dragon dictation along with smaller phrase technology. Any transcriptional errors that result from this process are unintentional.

## 2019-07-31 ENCOUNTER — Other Ambulatory Visit: Payer: Self-pay | Admitting: Family Medicine

## 2019-08-08 ENCOUNTER — Other Ambulatory Visit: Payer: Self-pay | Admitting: Family Medicine

## 2019-08-08 ENCOUNTER — Telehealth: Payer: Self-pay | Admitting: *Deleted

## 2019-08-08 NOTE — Telephone Encounter (Signed)
Received VM from patient.   Reports that she continues to have side pain.   Call placed to patient. LMTRC.

## 2019-08-10 ENCOUNTER — Other Ambulatory Visit: Payer: Self-pay | Admitting: Family Medicine

## 2019-08-10 NOTE — Telephone Encounter (Signed)
Received call from patient.   Reports that she has been using the Flexeril and that has relieved the most of the pain.

## 2019-08-11 NOTE — Telephone Encounter (Signed)
Ok to refill 

## 2019-09-12 ENCOUNTER — Other Ambulatory Visit: Payer: Self-pay

## 2019-09-12 ENCOUNTER — Telehealth (INDEPENDENT_AMBULATORY_CARE_PROVIDER_SITE_OTHER): Payer: BC Managed Care – PPO | Admitting: Nurse Practitioner

## 2019-09-12 ENCOUNTER — Encounter: Payer: Self-pay | Admitting: Nurse Practitioner

## 2019-09-12 VITALS — BP 140/78 | Temp 100.9°F | Ht 67.0 in | Wt 267.0 lb

## 2019-09-12 DIAGNOSIS — R05 Cough: Secondary | ICD-10-CM

## 2019-09-12 DIAGNOSIS — R059 Cough, unspecified: Secondary | ICD-10-CM

## 2019-09-12 DIAGNOSIS — J45901 Unspecified asthma with (acute) exacerbation: Secondary | ICD-10-CM | POA: Diagnosis not present

## 2019-09-12 MED ORDER — PREDNISONE 10 MG PO TABS: ORAL_TABLET | ORAL | 0 refills | Status: DC

## 2019-09-12 MED ORDER — AZITHROMYCIN 250 MG PO TABS: ORAL_TABLET | ORAL | 0 refills | Status: DC

## 2019-09-12 NOTE — Progress Notes (Signed)
Established Patient Office Visit  Subjective:  Patient ID: Haley Mcgrath, female    DOB: [birth year]  Age: 59 y.o. MRN: [id number]  CC:  Chief Complaint  Patient presents with  . Cough    HPI TAMETHA BANNING is a 59 year female. Sxs cough for 3-4 days  that is dry-non productive with wheezing and sob during coughing episodes. Associated sxs are feeling general fatigue. No loss of taste or smell, no nasal congestion or drainage, sore throat, fever or chills. Covid vaccinated. 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   She has used her rescue inhaler without relief of her sxs and believes that whatever the reason for her sxs she is having a flair of asthma. She does have if needed, codeine cough syrup has but did not take r/t working and makes drowsy.    Past Medical History:  Diagnosis Date  . Arthritis    bilateral in hands  . Asthma    history of childhood asthma  . Elevated liver enzymes   . Fatty liver   . High cholesterol   . Hypertension     Past Surgical History:  Procedure Laterality Date  . COLONOSCOPY N/A 05/05/2012   Procedure: COLONOSCOPY;  Surgeon: Malissa Hippo, MD;  Location: [facility];  Service: Endoscopy;  Laterality: N/A;  1030  . TUBAL LIGATION      Family History  Problem Relation Age of Onset  . Heart disease Father   . Hypertension Father   . Colon cancer Neg Hx     Social History   Socioeconomic History  . Marital status: Married    Spouse name: Not on file  . Number of children: Not on file  . Years of education: Not on file  . Highest education level: Not on file  Occupational History  . Not on file  Tobacco Use  . Smoking status: Never Smoker  . Smokeless tobacco: Never Used  Vaping Use  . Vaping Use: Never used  Substance and Sexual Activity  . Alcohol use: No  . Drug use: No  .  Sexual activity: Yes  Other Topics Concern  . Not on file  Social History Narrative  . Not on file   Social Determinants of Health   Financial Resource Strain:   . Difficulty of Paying Living Expenses:   Food Insecurity:   . Worried About Programme researcher, broadcasting/film/video in the Last Year:   . Barista in the Last Year:   Transportation Needs:   . Freight forwarder (Medical):   Marland Kitchen Lack of Transportation (Non-Medical):   Physical Activity:   . Days of Exercise per Week:   . Minutes of Exercise per Session:   Stress:   . Feeling of Stress :   Social Connections:   . Frequency of Communication with Friends and Family:   . Frequency of Social Gatherings with Friends and Family:   . Attends Religious Services:   . Active Member of Clubs or Organizations:   . Attends Banker Meetings:   Marland Kitchen Marital Status:   Intimate Partner Violence:   . Fear of Current or Ex-Partner:   . Emotionally Abused:   Marland Kitchen Physically Abused:   . Sexually Abused:     Outpatient Medications Prior to Visit  Medication Sig Dispense Refill  .  cyclobenzaprine (FLEXERIL) 10 MG tablet TAKE 1 TABLET BY MOUTH THREE TIMES DAILY AS NEEDED FOR MUSCLE SPASMS 30 tablet 1  . albuterol (PROVENTIL HFA;VENTOLIN HFA) 108 (90 Base) MCG/ACT inhaler Inhale 2 puffs into the lungs every 4 (four) hours as needed for wheezing or shortness of breath. 1 Inhaler 0  . albuterol (PROVENTIL) (2.5 MG/3ML) 0.083% nebulizer solution Take 3 mLs (2.5 mg total) by nebulization every 6 (six) hours as needed for wheezing or shortness of breath. 150 mL 1  . allopurinol (ZYLOPRIM) 100 MG tablet TAKE 1 TABLET(100 MG) BY MOUTH DAILY 30 tablet 6  . cetirizine (ZYRTEC) 10 MG tablet Take 10 mg by mouth daily.    . fluticasone (FLONASE) 50 MCG/ACT nasal spray SHAKE LIQUID AND USE 2 SPRAYS IN EACH NOSTRIL DAILY 16 g 6  . Fluticasone-Salmeterol (ADVAIR DISKUS) 250-50 MCG/DOSE AEPB Inhale 1 puff into the lungs 2 (two) times daily. 1 each 3  .  furosemide (LASIX) 20 MG tablet TAKE 1 TABLET(20 MG) BY MOUTH DAILY 30 tablet 3  . HYDROcodone-acetaminophen (NORCO) 5-325 MG tablet Take 1 tablet by mouth every 6 (six) hours as needed for moderate pain. 20 tablet 0  . ibuprofen (ADVIL) 800 MG tablet TAKE 1 TABLET BY MOUTH EVERY 8 HOURS AS NEEDED FOR PAIN 30 tablet 1  . montelukast (SINGULAIR) 10 MG tablet TAKE 1 TABLET(10 MG) BY MOUTH AT BEDTIME 30 tablet 6  . Multiple Vitamins-Minerals (ALIVE WOMENS 50+ PO) Take 1 tablet by mouth daily.    . simvastatin (ZOCOR) 20 MG tablet TAKE 1 TABLET(20 MG) BY MOUTH DAILY 90 tablet 3   No facility-administered medications prior to visit.    Allergies  Allergen Reactions  . Sulfonamide Derivatives Hives    ROS Review of Systems  All other systems reviewed and are negative.     Objective:    Physical Exam Vitals and nursing note reviewed.  Constitutional:      General: She is not in acute distress.    Appearance: Normal appearance. She is well-developed and well-groomed. She is ill-appearing. She is not toxic-appearing or diaphoretic.  HENT:     Head: Normocephalic and atraumatic.     Nose: Nose normal.     Mouth/Throat:     Mouth: Mucous membranes are moist.     Pharynx: Oropharynx is clear.  Eyes:     Extraocular Movements: Extraocular movements intact.     Conjunctiva/sclera: Conjunctivae normal.     Pupils: Pupils are equal, round, and reactive to light.  Cardiovascular:     Rate and Rhythm: Normal rate and regular rhythm.     Pulses: Normal pulses.     Heart sounds: Normal heart sounds.  Pulmonary:     Effort: Pulmonary effort is normal. No respiratory distress.     Breath sounds: Normal breath sounds.  Abdominal:     General: Abdomen is flat. Bowel sounds are normal.  Musculoskeletal:        General: Normal range of motion.     Cervical back: Normal range of motion and neck supple.  Skin:    General: Skin is warm and dry.     Capillary Refill: Capillary refill takes less  than 2 seconds.     Coloration: Skin is not jaundiced or pale.  Neurological:     General: No focal deficit present.     Mental Status: She is alert and oriented to person, place, and time.  Psychiatric:        Mood and Affect: Mood normal.  Behavior: Behavior normal. Behavior is cooperative.        Thought Content: Thought content normal.     BP 140/78   Temp (!) 100.9 F (38.3 C)   Ht 5\' 7"  (1.702 m)   Wt 267 lb (121.1 kg)   BMI 41.82 kg/m  Wt Readings from Last 3 Encounters:  09/12/19 267 lb (121.1 kg)  07/25/19 267 lb (121.1 kg)  09/07/18 287 lb (130.2 kg)     Health Maintenance Due  Topic Date Due  . COVID-19 Vaccine (1) Never done  . INFLUENZA VACCINE  09/03/2019  . PAP SMEAR-Modifier  10/13/2019    There are no preventive care reminders to display for this patient.  Lab Results  Component Value Date   TSH 2.065 01/21/2012   Lab Results  Component Value Date   WBC 6.3 08/02/2018   HGB 12.7 08/02/2018   HCT 38.3 08/02/2018   MCV 89.9 08/02/2018   PLT 297 08/02/2018   Lab Results  Component Value Date   NA 141 09/07/2018   K 4.0 09/07/2018   CO2 26 09/07/2018   GLUCOSE 91 09/07/2018   BUN 23 09/07/2018   CREATININE 1.10 (H) 09/07/2018   BILITOT 0.5 09/07/2018   ALKPHOS 96 08/04/2016   AST 60 (H) 09/07/2018   ALT 46 (H) 09/07/2018   PROT 7.3 09/07/2018   ALBUMIN 4.1 08/04/2016   CALCIUM 10.1 09/07/2018   Lab Results  Component Value Date   CHOL 165 06/24/2018   Lab Results  Component Value Date   HDL 58 06/24/2018   Lab Results  Component Value Date   LDLCALC 86 06/24/2018   Lab Results  Component Value Date   TRIG 117 06/24/2018   Lab Results  Component Value Date   CHOLHDL 2.8 06/24/2018   No results found for: HGBA1C    Assessment & Plan:   Problem List Items Addressed This Visit    None    Visit Diagnoses    Cough    -  Primary   Relevant Orders   SARS-COV-2 RNA,(COVID-19) QUAL NAAT   Exacerbation of asthma,  unspecified asthma severity, unspecified whether persistent       Relevant Medications   predniSONE (DELTASONE) 10 MG tablet   azithromycin (ZITHROMAX) 250 MG tablet    Will COVID test r/t your exposure with sxs.   You are having asthma exacerbation: continue to use your asthma medication as prescribed, adding prescription for steroid and antibiotic. Seek medical attention for new, non resolving, or worsening sxs such as difficulty breathing or shortness of breath not controlled by your inhaler.   Meds ordered this encounter  Medications  . predniSONE (DELTASONE) 10 MG tablet    Sig: Day 1 take six tablets orally Day 2 take five tablets orally Day 3 take four tablets orally Day 4 take three tablets orally Day 5 take two tablets orally Day 6 take one tablet orally    Dispense:  21 tablet    Refill:  0  . azithromycin (ZITHROMAX) 250 MG tablet    Sig: Take 2 tablets (500 mg) on Day 1, followed by 1 tablet (250 mg) once daily on Days 2 through 5.    Dispense:  6 tablet    Refill:  0    Follow-up: Return if symptoms worsen or fail to improve.    06/26/2018, FNP

## 2019-09-13 LAB — SARS-COV-2 RNA,(COVID-19) QUALITATIVE NAAT: SARS CoV2 RNA: NOT DETECTED

## 2019-09-14 NOTE — Progress Notes (Signed)
SARS CoV2 RNA Not Detect Not Detected

## 2019-09-25 ENCOUNTER — Other Ambulatory Visit: Payer: Self-pay | Admitting: Family Medicine

## 2019-09-29 ENCOUNTER — Telehealth: Payer: Self-pay | Admitting: *Deleted

## 2019-09-29 NOTE — Telephone Encounter (Signed)
Call placed to patient and patient made aware.  

## 2019-09-29 NOTE — Telephone Encounter (Signed)
Received call from patient.   Reports that she was in contact with her sick son on Tuesday, and then was advised that he tested positive for COVID.   Reports that she has no Sx at this time, and she is fully vaccinated.   Inquired as to what she should do.   MD please advise.

## 2019-09-29 NOTE — Telephone Encounter (Signed)
  Nothing to do at this time, if she becomes symptomatic then she needs to be tested  She does not need to quarentine

## 2019-10-05 ENCOUNTER — Encounter: Payer: Self-pay | Admitting: Neurology

## 2019-10-05 ENCOUNTER — Ambulatory Visit: Payer: BC Managed Care – PPO | Admitting: Neurology

## 2019-10-05 VITALS — BP 168/84 | HR 64 | Ht 67.0 in | Wt 266.0 lb

## 2019-10-05 DIAGNOSIS — G5603 Carpal tunnel syndrome, bilateral upper limbs: Secondary | ICD-10-CM | POA: Diagnosis not present

## 2019-10-05 DIAGNOSIS — R7989 Other specified abnormal findings of blood chemistry: Secondary | ICD-10-CM | POA: Diagnosis not present

## 2019-10-05 DIAGNOSIS — R799 Abnormal finding of blood chemistry, unspecified: Secondary | ICD-10-CM | POA: Diagnosis not present

## 2019-10-05 DIAGNOSIS — R7309 Other abnormal glucose: Secondary | ICD-10-CM | POA: Diagnosis not present

## 2019-10-05 DIAGNOSIS — E785 Hyperlipidemia, unspecified: Secondary | ICD-10-CM | POA: Diagnosis not present

## 2019-10-05 MED ORDER — GABAPENTIN 300 MG PO CAPS: 300.0000 mg | ORAL_CAPSULE | Freq: Three times a day (TID) | ORAL | 11 refills | Status: DC

## 2019-10-05 NOTE — Progress Notes (Signed)
Chief Complaint  Patient presents with  . New Patient (Initial Visit)    She is here for evaluation of bilateral carpal tunnel syndrome.  Marland Kitchen PCP    Jeanice Lim, Velna Hatchet, MD    HISTORICAL  Haley Mcgrath is a 59 year old female, seen in request by her primary care physician Milinda Antis F for evaluation of bilateral carpal tunnel syndrome, initial evaluation was on October 05, 2019  I reviewed and summarized the referring note. HLD HTN Asthma  She worked as Scientist, physiological job, third shift, 7 PM to 7 AM, with repetitive hand, wrist motion, around 2020, she began to develop intermittent bilateral hands paresthesia, involving left nondominant hand more, especially when she tried to sleep, is often wake her up from sleep, she has to shake her hands to make the numbness burning sensation goes away  She was seen by local hand specialist in early 2020, was diagnosed with mild bilateral carpal tunnel syndromes, only offered as needed NSAIDs  Recent 6 months, she complains of worsening symptoms, she denies significant difficulty at work, no weakness, but her hands paresthesia wake her up multiple times while she try to sleep, she has to take Flexeril sometimes in combination with ibuprofen 800 mg as needed  She did have history of neck pain in the past, with radiating pain to right shoulder which has improved with epidural injection then, she denies significant neck pain now, no radiating pain to left arm  I personally reviewed MRI of cervical spine in 2011, single level of C6-7 degenerative disc disease with right paracentral to posterior lateral disc extrusion, with moderate central stenosis  She denies bilateral lower extremity paresthesia, denies gait abnormality no bowel bladder incontinence   REVIEW OF SYSTEMS: Full 14 system review of systems performed and notable only for as above All other review of systems were negative.  ALLERGIES: Allergies  Allergen Reactions  .  Sulfonamide Derivatives Hives    HOME MEDICATIONS: Current Outpatient Medications  Medication Sig Dispense Refill  . albuterol (PROVENTIL HFA;VENTOLIN HFA) 108 (90 Base) MCG/ACT inhaler Inhale 2 puffs into the lungs every 4 (four) hours as needed for wheezing or shortness of breath. 1 Inhaler 0  . albuterol (PROVENTIL) (2.5 MG/3ML) 0.083% nebulizer solution Take 3 mLs (2.5 mg total) by nebulization every 6 (six) hours as needed for wheezing or shortness of breath. 150 mL 1  . allopurinol (ZYLOPRIM) 100 MG tablet TAKE 1 TABLET(100 MG) BY MOUTH DAILY 30 tablet 6  . cetirizine (ZYRTEC) 10 MG tablet Take 10 mg by mouth daily.    . cyclobenzaprine (FLEXERIL) 10 MG tablet TAKE 1 TABLET BY MOUTH THREE TIMES DAILY AS NEEDED FOR MUSCLE SPASMS 30 tablet 1  . fluticasone (FLONASE) 50 MCG/ACT nasal spray SHAKE LIQUID AND USE 2 SPRAYS IN EACH NOSTRIL DAILY 16 g 6  . Fluticasone-Salmeterol (ADVAIR DISKUS) 250-50 MCG/DOSE AEPB Inhale 1 puff into the lungs 2 (two) times daily. 1 each 3  . furosemide (LASIX) 20 MG tablet TAKE 1 TABLET(20 MG) BY MOUTH DAILY 30 tablet 3  . ibuprofen (ADVIL) 800 MG tablet TAKE 1 TABLET BY MOUTH EVERY 8 HOURS AS NEEDED FOR PAIN 30 tablet 1  . montelukast (SINGULAIR) 10 MG tablet TAKE 1 TABLET(10 MG) BY MOUTH AT BEDTIME 30 tablet 6  . Multiple Vitamins-Minerals (ALIVE WOMENS 50+ PO) Take 1 tablet by mouth daily.    . simvastatin (ZOCOR) 20 MG tablet TAKE 1 TABLET(20 MG) BY MOUTH DAILY 90 tablet 3   No current facility-administered medications  for this visit.    PAST MEDICAL HISTORY: Past Medical History:  Diagnosis Date  . Arthritis    bilateral in hands  . Asthma    history of childhood asthma  . CTS (carpal tunnel syndrome)   . Elevated liver enzymes   . Fatty liver   . High cholesterol   . Hypertension     PAST SURGICAL HISTORY: Past Surgical History:  Procedure Laterality Date  . COLONOSCOPY N/A 05/05/2012   Procedure: COLONOSCOPY;  Surgeon: Malissa Hippo, MD;   Location: [facility];  Service: Endoscopy;  Laterality: N/A;  1030  . TUBAL LIGATION      FAMILY HISTORY: Family History  Problem Relation Age of Onset  . Heart disease Father   . Hypertension Father   . Other Mother        died of natural causes - no known health issues  . Colon cancer Neg Hx     SOCIAL HISTORY: Social History   Socioeconomic History  . Marital status: Married    Spouse name: Not on file  . Number of children: 3  . Years of education: 87  . Highest education level: High school graduate  Occupational History  . Occupation: Spinner - yarn  Tobacco Use  . Smoking status: Never Smoker  . Smokeless tobacco: Never Used  Vaping Use  . Vaping Use: Never used  Substance and Sexual Activity  . Alcohol use: No  . Drug use: No  . Sexual activity: Yes  Other Topics Concern  . Not on file  Social History Narrative   Lives at home with family.   Right-handed.   Caffeine use: one soda per day, occasional coffee.   Social Determinants of Health   Financial Resource Strain:   . Difficulty of Paying Living Expenses: Not on file  Food Insecurity:   . Worried About Programme researcher, broadcasting/film/video in the Last Year: Not on file  . Ran Out of Food in the Last Year: Not on file  Transportation Needs:   . Lack of Transportation (Medical): Not on file  . Lack of Transportation (Non-Medical): Not on file  Physical Activity:   . Days of Exercise per Week: Not on file  . Minutes of Exercise per Session: Not on file  Stress:   . Feeling of Stress : Not on file  Social Connections:   . Frequency of Communication with Friends and Family: Not on file  . Frequency of Social Gatherings with Friends and Family: Not on file  . Attends Religious Services: Not on file  . Active Member of Clubs or Organizations: Not on file  . Attends Banker Meetings: Not on file  . Marital Status: Not on file  Intimate Partner Violence:   . Fear of Current or Ex-Partner: Not on file  .  Emotionally Abused: Not on file  . Physically Abused: Not on file  . Sexually Abused: Not on file     PHYSICAL EXAM   Vitals:   10/05/19 0817  BP: (!) 168/84  Pulse: 64  Weight: 266 lb (120.7 kg)  Height: 5\' 7"  (1.702 m)   Not recorded     Body mass index is 41.66 kg/m.  PHYSICAL EXAMNIATION:  Gen: NAD, conversant, well nourised, well groomed                     Cardiovascular: Regular rate rhythm, no peripheral edema, warm, nontender. Eyes: Conjunctivae clear without exudates or hemorrhage Neck: Supple, no  carotid bruits. Pulmonary: Clear to auscultation bilaterally   NEUROLOGICAL EXAM:  MENTAL STATUS: Speech:    Speech is normal; fluent and spontaneous with normal comprehension.  Cognition:     Orientation to time, place and person     Normal recent and remote memory     Normal Attention span and concentration     Normal Language, naming, repeating,spontaneous speech     Fund of knowledge   CRANIAL NERVES: CN II: Visual fields are full to confrontation. Pupils are round equal and briskly reactive to light. CN III, IV, VI: extraocular movement are normal. No ptosis. CN V: Facial sensation is intact to light touch CN VII: Face is symmetric with normal eye closure  CN VIII: Hearing is normal to causal conversation. CN IX, X: Phonation is normal. CN XI: Head turning and shoulder shrug are intact  MOTOR: There is no weakness at bilateral abductor pollicis brevis, opponens, normal bilateral proximal upper extremity weakness, normal strength of bilateral lower extremity  REFLEXES: Reflexes are 2+ and symmetric at the biceps, triceps, knees, and ankles. Plantar responses are flexor.  SENSORY: Intact to light touch, pinprick and vibratory sensation are intact in fingers and toes with exception of decreased pinprick at finger pads  COORDINATION: There is no trunk or limb dysmetria noted.  GAIT/STANCE: Posture is normal. Gait is steady with normal steps, base,  arm swing, and turning. Heel and toe walking are normal. Tandem gait is normal.  Romberg is absent.   DIAGNOSTIC DATA (LABS, IMAGING, TESTING) - I reviewed patient records, labs, notes, testing and imaging myself where available.   ASSESSMENT AND PLAN  Haley Mcgrath is a 58 y.o. female   Bilateral hands paresthesia  Most consistent with bilateral carpal tunnel syndrome, that is related to her repetitive hand movement during her job  Bilateral wrist splint  Gabapentin 300 mg up to 3 tablets every night for symptoms control,  EMG nerve conduction study  Laboratory evaluation to rule out underlying disease  History of right cervical radiculopathy  She has hyperreflexia on examination, if there was no significant abnormality on electrodiagnostic study, may consider MRI of cervical spine  Levert Feinstein, M.D. Ph.D.  [facility] [address] [place], [place] [address] Ph: [phone] Fax: [phone]  CC:  Salley Scarlet, MD [address] [place],  [place] [address]

## 2019-10-06 LAB — LIPID PANEL
Chol/HDL Ratio: 2.9 ratio (ref 0.0–4.4)
Cholesterol, Total: 167 mg/dL (ref 100–199)
HDL: 58 mg/dL (ref >39)
LDL Chol Calc (NIH): 94 mg/dL (ref 0–99)
Triglycerides: 81 mg/dL (ref 0–149)
VLDL Cholesterol Cal: 15 mg/dL (ref 5–40)

## 2019-10-06 LAB — CBC WITH DIFFERENTIAL
Basophils Absolute: 0 10*3/uL (ref 0.0–0.2)
Basos: 1 %
EOS (ABSOLUTE): 0.4 10*3/uL (ref 0.0–0.4)
Eos: 8 %
Hematocrit: 39.8 % (ref 34.0–46.6)
Hemoglobin: 12.9 g/dL (ref 11.1–15.9)
Immature Grans (Abs): 0 10*3/uL (ref 0.0–0.1)
Immature Granulocytes: 0 %
Lymphocytes Absolute: 1.7 10*3/uL (ref 0.7–3.1)
Lymphs: 36 %
MCH: 29.7 pg (ref 26.6–33.0)
MCHC: 32.4 g/dL (ref 31.5–35.7)
MCV: 92 fL (ref 79–97)
Monocytes Absolute: 0.4 10*3/uL (ref 0.1–0.9)
Monocytes: 8 %
Neutrophils Absolute: 2.3 10*3/uL (ref 1.4–7.0)
Neutrophils: 47 %
RBC: 4.35 x10E6/uL (ref 3.77–5.28)
RDW: 14.3 % (ref 11.7–15.4)
WBC: 4.8 10*3/uL (ref 3.4–10.8)

## 2019-10-06 LAB — COMPREHENSIVE METABOLIC PANEL
ALT: 37 IU/L — ABNORMAL HIGH (ref 0–32)
AST: 34 IU/L (ref 0–40)
Albumin/Globulin Ratio: 2 (ref 1.2–2.2)
Albumin: 4.5 g/dL (ref 3.8–4.9)
Alkaline Phosphatase: 134 IU/L — ABNORMAL HIGH (ref 48–121)
BUN/Creatinine Ratio: 18 (ref 9–23)
BUN: 15 mg/dL (ref 6–24)
Bilirubin Total: 0.6 mg/dL (ref 0.0–1.2)
CO2: 26 mmol/L (ref 20–29)
Calcium: 9.5 mg/dL (ref 8.7–10.2)
Chloride: 103 mmol/L (ref 96–106)
Creatinine, Ser: 0.84 mg/dL (ref 0.57–1.00)
GFR calc Af Amer: 89 mL/min/{1.73_m2} (ref >59)
GFR calc non Af Amer: 77 mL/min/{1.73_m2} (ref >59)
Globulin, Total: 2.2 g/dL (ref 1.5–4.5)
Glucose: 93 mg/dL (ref 65–99)
Potassium: 4 mmol/L (ref 3.5–5.2)
Sodium: 142 mmol/L (ref 134–144)
Total Protein: 6.7 g/dL (ref 6.0–8.5)

## 2019-10-06 LAB — VITAMIN B12: Vitamin B-12: 570 pg/mL (ref 232–1245)

## 2019-10-06 LAB — TSH: TSH: 1.92 u[IU]/mL (ref 0.450–4.500)

## 2019-10-06 LAB — RPR: RPR Ser Ql: NONREACTIVE

## 2019-10-06 LAB — HGB A1C W/O EAG: Hgb A1c MFr Bld: 5.7 % — ABNORMAL HIGH (ref 4.8–5.6)

## 2019-10-18 ENCOUNTER — Other Ambulatory Visit (HOSPITAL_COMMUNITY): Payer: Self-pay | Admitting: Family Medicine

## 2019-10-18 DIAGNOSIS — Z1231 Encounter for screening mammogram for malignant neoplasm of breast: Secondary | ICD-10-CM

## 2019-10-27 ENCOUNTER — Other Ambulatory Visit: Payer: Self-pay

## 2019-10-27 ENCOUNTER — Ambulatory Visit (HOSPITAL_COMMUNITY)
Admission: RE | Admit: 2019-10-27 | Discharge: 2019-10-27 | Disposition: A | Discharge status: Home / Self Care | Payer: BC Managed Care – PPO | Source: Ambulatory Visit | Attending: Family Medicine | Admitting: Family Medicine

## 2019-10-27 DIAGNOSIS — Z1231 Encounter for screening mammogram for malignant neoplasm of breast: Secondary | ICD-10-CM | POA: Insufficient documentation

## 2019-11-08 ENCOUNTER — Encounter: Payer: Self-pay | Admitting: *Deleted

## 2019-11-15 ENCOUNTER — Other Ambulatory Visit: Payer: Self-pay

## 2019-11-15 ENCOUNTER — Ambulatory Visit (INDEPENDENT_AMBULATORY_CARE_PROVIDER_SITE_OTHER): Payer: BC Managed Care – PPO | Admitting: Neurology

## 2019-11-15 ENCOUNTER — Ambulatory Visit: Payer: BC Managed Care – PPO | Admitting: Neurology

## 2019-11-15 DIAGNOSIS — G5603 Carpal tunnel syndrome, bilateral upper limbs: Secondary | ICD-10-CM

## 2019-11-15 DIAGNOSIS — Z0289 Encounter for other administrative examinations: Secondary | ICD-10-CM

## 2019-11-15 NOTE — Procedures (Signed)
Full Name: Haley Mcgrath Gender: Female MRN #: [id number] Date of Birth: 1960/04/01    Visit Date: 11/15/2019 08:42 Age: 59 Years Examining Physician: Levert Feinstein, MD  Referring Physician: Levert Feinstein, MD Height: 5 feet 7 inch History: 59 year old female presented with intermittent bilateral hands paresthesia, left worse than right.  Summary of the test:  Nerve conduction study: Bilateral median sensory responses showed moderately prolonged peak latency, left side also has moderately decreased snap amplitude. Bilateral ulnar sensory and motor responses were normal. Bilateral median motor responses showed moderately prolonged distal latency, with normal CMAP amplitude, and conduction velocity.  Electromyography: Selected needle examination of left upper extremity muscles, and the right abductor pollicis brevis was performed.  There was evidence of mildly decreased recruitment patterns at bilateral abductor pollicis brevis.  Conclusion: This is an abnormal study.  There is electrodiagnostic evidence of bilateral median neuropathy across the wrist, consistent with moderate bilateral carpal tunnel syndromes, demyelinating in nature, left worse than right.    ------------------------------- Levert Feinstein, M.D. PhD  [facility] [address] [place], [place] [address] Tel: [phone] Fax: [phone]  Verbal informed consent was obtained from the patient, patient was informed of potential risk of procedure, including bruising, bleeding, hematoma formation, infection, muscle weakness, muscle pain, numbness, among others.        MNC    Nerve / Sites Muscle Latency Ref. Amplitude Ref. Rel Amp Segments Distance Velocity Ref. Area    ms ms mV mV %  cm m/s m/s mVms  R Median - APB     Wrist APB 5.0 ?4.4 9.3 ?4.0 100 Wrist - APB 7   39.9     Upper arm APB 9.6  9.3  99.7 Upper arm - Wrist 24 52 ?49 39.2  L Median - APB     Wrist APB 5.7 ?4.4 8.9 ?4.0 100  Wrist - APB 7   41.0     Upper arm APB 10.3  9.3  105 Upper arm - Wrist 24 52 ?49 39.7  R Ulnar - ADM     Wrist ADM 3.0 ?3.3 13.8 ?6.0 100 Wrist - ADM 7   40.4     B.Elbow ADM 6.9  11.4  82.4 B.Elbow - Wrist 21 53 ?49 36.3     A.Elbow ADM 8.9  11.2  98.2 A.Elbow - B.Elbow 10 52 ?49 36.5  L Ulnar - ADM     Wrist ADM 3.1 ?3.3 12.7 ?6.0 100 Wrist - ADM 7   36.2     B.Elbow ADM 7.4  10.9  86.3 B.Elbow - Wrist 21 49 ?49 34.3     A.Elbow ADM 9.4  10.7  98.2 A.Elbow - B.Elbow 10 49 ?49 35.0             SNC    Nerve / Sites Rec. Site Peak Lat Ref.  Amp Ref. Segments Distance    ms ms V V  cm  R Median - Orthodromic (Dig II, Mid palm)     Dig II Wrist 4.4 ?3.4 11 ?10 Dig II - Wrist 13  L Median - Orthodromic (Dig II, Mid palm)     Dig II Wrist 6.1 ?3.4 4 ?10 Dig II - Wrist 13  R Ulnar - Orthodromic, (Dig V, Mid palm)     Dig V Wrist 3.0 ?3.1 12 ?5 Dig V - Wrist 11  L Ulnar - Orthodromic, (Dig V, Mid palm)     Dig V Wrist  2.9 ?3.1 17 ?5 Dig V - Wrist 43             F  Wave    Nerve F Lat Ref.   ms ms  R Ulnar - ADM 31.3 ?32.0  L Ulnar - ADM 31.3 ?32.0         EMG Summary Table    Spontaneous MUAP Recruitment  Muscle IA Fib PSW Fasc Other Amp Dur. Poly Pattern  L. First dorsal interosseous Normal None None None _______ Normal Normal Normal Normal  L. Abductor pollicis brevis Normal None None None _______ Normal Normal Normal Reduced  L. Pronator teres Normal None None None _______ Normal Normal Normal Normal  L. Biceps brachii Normal None None None _______ Normal Normal Normal Normal  L. Deltoid Normal None None None _______ Normal Normal Normal Normal  L. Triceps brachii Normal None None None _______ Normal Normal Normal Normal  L. Extensor digitorum communis Normal None None None _______ Normal Normal Normal Normal  R. First dorsal interosseous Normal None None None _______ Normal Normal Normal Normal  R. Abductor pollicis brevis Normal None None None _______ Normal Normal Normal  Reduced

## 2019-11-16 ENCOUNTER — Other Ambulatory Visit: Payer: Self-pay | Admitting: Family Medicine

## 2019-12-06 ENCOUNTER — Ambulatory Visit: Payer: BC Managed Care – PPO | Admitting: Family Medicine

## 2019-12-06 ENCOUNTER — Telehealth: Payer: Self-pay | Admitting: *Deleted

## 2019-12-06 ENCOUNTER — Encounter: Payer: Self-pay | Admitting: Family Medicine

## 2019-12-06 ENCOUNTER — Other Ambulatory Visit: Payer: Self-pay

## 2019-12-06 VITALS — HR 73 | Temp 98.9°F | Resp 18

## 2019-12-06 DIAGNOSIS — J069 Acute upper respiratory infection, unspecified: Secondary | ICD-10-CM

## 2019-12-06 DIAGNOSIS — Z20822 Contact with and (suspected) exposure to covid-19: Secondary | ICD-10-CM | POA: Diagnosis not present

## 2019-12-06 DIAGNOSIS — J029 Acute pharyngitis, unspecified: Secondary | ICD-10-CM | POA: Diagnosis not present

## 2019-12-06 MED ORDER — ALLOPURINOL 100 MG PO TABS
ORAL_TABLET | ORAL | 3 refills | Status: DC
Start: 2019-12-06 — End: 2022-06-05

## 2019-12-06 MED ORDER — MONTELUKAST SODIUM 10 MG PO TABS
ORAL_TABLET | ORAL | 2 refills | Status: DC
Start: 2019-12-06 — End: 2020-02-12

## 2019-12-06 MED ORDER — GUAIFENESIN-CODEINE 100-10 MG/5ML PO SOLN: 10.0000 mL | Freq: Four times a day (QID) | ORAL | 0 refills | Status: DC | PRN

## 2019-12-06 MED ORDER — SIMVASTATIN 20 MG PO TABS: ORAL_TABLET | ORAL | 3 refills | Status: AC

## 2019-12-06 MED ORDER — FUROSEMIDE 20 MG PO TABS
ORAL_TABLET | ORAL | 3 refills | Status: DC
Start: 2019-12-06 — End: 2020-07-17

## 2019-12-06 NOTE — Progress Notes (Signed)
   Subjective:    Patient ID: Haley Mcgrath, female    DOB: 07-Jan-1961, 60 y.o.   MRN: [id number]  Patient presents for Nasal Congestion (SORE THROAT, HA)  Pt here with sore throat, headache, nasal congestion and sinus pressure that started this AM. Sunday called out of work because she just didn't feel right and was fatigued. She has had mild sneezing, minimal cough  no body aches, no loss of taste or smell No GI symptoms She has used her regular allergy meds No fever at home COVID/FLU vaccine UTD Works around a lot of people that [employer] wear mask and come to work with symptoms    Review Of Systems:  GEN- denies fatigue, fever, weight loss,weakness, recent illness HEENT- denies eye drainage, change in vision, +nasal discharge, CVS- denies chest pain, palpitations RESP- denies SOB, + occ cough, wheeze ABD- denies N/V, change in stools, abd pain GU- denies dysuria, hematuria, dribbling, incontinence MSK- denies joint pain, muscle aches, injury Neuro- denies headache, dizziness, syncope, seizure activity      Pt examined in car due to COVID-19  Objective:    Pulse 73   Temp 98.9 F (37.2 C) (Temporal)   Resp 18   SpO2 97%  GEN- NAD, alert and oriented x3 HEENT- PERRL, EOMI, non injected sclera, pink conjunctiva, MMM, oropharynx clear,clear rhinorrhea, enlarged turbinates  Neck- Supple, no LAD  CVS- RRR, no murmur RESP-CTAB EXT- No edema Pulses- Radial 2+        Assessment & Plan:      Problem List Items Addressed This Visit    None    Visit Diagnoses    Encounter for laboratory testing for COVID-19 virus    -  Primary   Relevant Orders   SARS-COV-2 RNA,(COVID-19) QUAL NAAT   Upper respiratory tract infection, unspecified type       concern for URI, early in symptoms but started with fatigue, will add nasal steroid, continue other allergy med, robitussin codiene, watch for fever, covid swab Supportive care    Relevant Orders   SARS-COV-2 RNA,(COVID-19) QUAL NAAT       Note: This dictation was prepared with Dragon dictation along with smaller phrase technology. Any transcriptional errors that result from this process are unintentional.

## 2019-12-06 NOTE — Patient Instructions (Signed)
We will call with results in 48hours or it will be available to MyChart   Take mucinex DM or robitussin DM for cough Take Tylenol or ibuprofen for fever  If you have any difficulty breathing go to ER If you are unable to tolerate fluids go to ER   

## 2019-12-06 NOTE — Telephone Encounter (Signed)
noted 

## 2019-12-06 NOTE — Telephone Encounter (Signed)
Received call from patient.   Reports that she has x1 day of sore throat and nasal congestion. Denies fever, cough, HA, SOB.   Reports that she has had COVID vaccines and Flu Vaccine for 2021.  Appointment scheduled and patient will stay in car.

## 2019-12-07 LAB — SARS-COV-2 RNA,(COVID-19) QUALITATIVE NAAT: SARS CoV2 RNA: NOT DETECTED

## 2019-12-18 ENCOUNTER — Other Ambulatory Visit: Payer: Self-pay

## 2019-12-18 ENCOUNTER — Ambulatory Visit: Payer: BC Managed Care – PPO | Admitting: Family Medicine

## 2019-12-18 ENCOUNTER — Encounter: Payer: Self-pay | Admitting: Family Medicine

## 2019-12-18 VITALS — BP 128/72 | HR 73 | Temp 97.3°F | Resp 18 | Ht 67.0 in | Wt 272.0 lb

## 2019-12-18 DIAGNOSIS — R7303 Prediabetes: Secondary | ICD-10-CM | POA: Diagnosis not present

## 2019-12-18 DIAGNOSIS — Z0001 Encounter for general adult medical examination with abnormal findings: Secondary | ICD-10-CM

## 2019-12-18 DIAGNOSIS — E669 Obesity, unspecified: Secondary | ICD-10-CM | POA: Diagnosis not present

## 2019-12-18 DIAGNOSIS — Z124 Encounter for screening for malignant neoplasm of cervix: Secondary | ICD-10-CM

## 2019-12-18 DIAGNOSIS — I10 Essential (primary) hypertension: Secondary | ICD-10-CM

## 2019-12-18 DIAGNOSIS — Z0000 Encounter for general adult medical examination without abnormal findings: Secondary | ICD-10-CM

## 2019-12-18 DIAGNOSIS — J4540 Moderate persistent asthma, uncomplicated: Secondary | ICD-10-CM

## 2019-12-18 MED ORDER — METFORMIN HCL 500 MG PO TABS: 500.0000 mg | ORAL_TABLET | Freq: Every day | ORAL | 1 refills | Status: DC

## 2019-12-18 NOTE — Assessment & Plan Note (Signed)
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  We will refer her to pulmonology for further recommendations to you Singulair Zyrtec Advair albuterol as needed

## 2019-12-18 NOTE — Progress Notes (Signed)
   Subjective:    Patient ID: Haley Mcgrath, female    DOB: 1961-01-11, 58 y.o.   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    Review Of Systems:  GEN- denies fatigue, fever, weight loss,weakness, recent illness HEENT- denies eye drainage, change in vision, nasal discharge, CVS- denies chest pain, palpitations RESP- denies SOB, +cough, wheeze ABD- denies N/V, change in stools, abd pain GU- denies dysuria, hematuria, dribbling, incontinence MSK- denies joint pain, muscle aches, injury Neuro- denies headache, dizziness, syncope, seizure activity       Objective:    BP 128/72   Pulse 73   Temp (!) 97.3 F (36.3 C)   Resp 18   Ht 5\' 7"  (1.702 m)   Wt 272 lb (123.4 kg)   SpO2 98%   BMI 42.60 kg/m  GEN- NAD, alert and oriented x3 HEENT- PERRL, EOMI, non injected sclera, pink conjunctiva, MMM, oropharynx clear Neck- Supple, no thyromegaly CVS- RRR, no murmur RESP-CTAB ABD-NABS,soft,NT,ND GU- normal external genitalia, vaginal mucosa pink and moist, cervix visualized no growth, no blood form os, minimal thin clear discharge, no CMT, no ovarian masses, uterus normal size EXT- No edema Pulses- Radial, DP- 2+        Assessment & Plan:      Problem List Items Addressed This Visit      Unprioritized   Asthma    I think her asthma is uncontrolled unfortunately she is in a setting where 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  We will refer her to pulmonology for further recommendations to you Singulair Zyrtec Advair albuterol as needed      Relevant Orders   Ambulatory referral to Pulmonology   Borderline diabetes    Dietary changes discussed.  Also start her on low-dose Metformin 500 mg once a day      Class 3 obesity   Essential hypertension, benign    Blood pressure is controlled Normal renal function       Other Visit Diagnoses    Routine general medical examination at a health care facility    -  Primary   CPE done.  Recent fasting labs reviewed Pap smear done   Cervical cancer screening          Note: This dictation was prepared with Dragon dictation along with smaller phrase technology. Any transcriptional errors that result from this process are unintentional.

## 2019-12-18 NOTE — Patient Instructions (Addendum)
Referral to asthma doctor F/U 3 months

## 2019-12-18 NOTE — Addendum Note (Signed)
Addended by: Phillips Odor on: 12/18/2019 02:38 PM   Modules accepted: Orders

## 2019-12-18 NOTE — Assessment & Plan Note (Signed)
Blood pressure is controlled Normal renal function

## 2019-12-18 NOTE — Assessment & Plan Note (Signed)
Dietary changes discussed.  Also start her on low-dose Metformin 500 mg once a day

## 2019-12-19 LAB — PAP IG W/ RFLX HPV ASCU

## 2019-12-20 ENCOUNTER — Encounter: Payer: Self-pay | Admitting: *Deleted

## 2020-01-11 ENCOUNTER — Encounter: Payer: Self-pay | Admitting: *Deleted

## 2020-01-11 ENCOUNTER — Other Ambulatory Visit: Payer: Self-pay | Admitting: *Deleted

## 2020-01-11 MED ORDER — BENZONATATE 200 MG PO CAPS: 200.0000 mg | ORAL_CAPSULE | Freq: Two times a day (BID) | ORAL | 0 refills | Status: DC | PRN

## 2020-02-12 ENCOUNTER — Ambulatory Visit (INDEPENDENT_AMBULATORY_CARE_PROVIDER_SITE_OTHER): Payer: BC Managed Care – PPO | Admitting: Internal Medicine

## 2020-02-12 ENCOUNTER — Other Ambulatory Visit: Payer: Self-pay

## 2020-02-12 ENCOUNTER — Ambulatory Visit (HOSPITAL_COMMUNITY)
Admission: RE | Admit: 2020-02-12 | Discharge: 2020-02-12 | Disposition: A | Discharge status: Home / Self Care | Payer: BC Managed Care – PPO | Source: Ambulatory Visit | Attending: Internal Medicine | Admitting: Internal Medicine

## 2020-02-12 ENCOUNTER — Encounter: Payer: Self-pay | Admitting: Internal Medicine

## 2020-02-12 ENCOUNTER — Other Ambulatory Visit (HOSPITAL_COMMUNITY)
Admission: RE | Admit: 2020-02-12 | Discharge: 2020-02-12 | Disposition: A | Discharge status: Home / Self Care | Payer: BC Managed Care – PPO | Source: Ambulatory Visit | Attending: Internal Medicine | Admitting: Internal Medicine

## 2020-02-12 DIAGNOSIS — J45991 Cough variant asthma: Secondary | ICD-10-CM

## 2020-02-12 DIAGNOSIS — R059 Cough, unspecified: Secondary | ICD-10-CM | POA: Diagnosis not present

## 2020-02-12 DIAGNOSIS — J45909 Unspecified asthma, uncomplicated: Secondary | ICD-10-CM | POA: Diagnosis not present

## 2020-02-12 LAB — CBC WITH DIFFERENTIAL/PLATELET
Abs Immature Granulocytes: 0.01 10*3/uL (ref 0.00–0.07)
Basophils Absolute: 0 10*3/uL (ref 0.0–0.1)
Basophils Relative: 1 %
Eosinophils Absolute: 0.3 10*3/uL (ref 0.0–0.5)
Eosinophils Relative: 5 %
HCT: 38 % (ref 36.0–46.0)
Hemoglobin: 12.1 g/dL (ref 12.0–15.0)
Immature Granulocytes: 0 %
Lymphocytes Relative: 40 %
Lymphs Abs: 2.5 10*3/uL (ref 0.7–4.0)
MCH: 30 pg (ref 26.0–34.0)
MCHC: 31.8 g/dL (ref 30.0–36.0)
MCV: 94.1 fL (ref 80.0–100.0)
Monocytes Absolute: 0.5 10*3/uL (ref 0.1–1.0)
Monocytes Relative: 8 %
Neutro Abs: 2.8 10*3/uL (ref 1.7–7.7)
Neutrophils Relative %: 46 %
Platelets: 265 10*3/uL (ref 150–400)
RBC: 4.04 MIL/uL (ref 3.87–5.11)
RDW: 14.6 % (ref 11.5–15.5)
WBC: 6.2 10*3/uL (ref 4.0–10.5)
nRBC: 0 % (ref 0.0–0.2)

## 2020-02-12 MED ORDER — PANTOPRAZOLE SODIUM 40 MG PO TBEC: 40.0000 mg | DELAYED_RELEASE_TABLET | Freq: Every day | ORAL | 2 refills | Status: DC

## 2020-02-12 MED ORDER — FAMOTIDINE 20 MG PO TABS: ORAL_TABLET | ORAL | 11 refills | Status: DC

## 2020-02-12 MED ORDER — BENZONATATE 200 MG PO CAPS: 200.0000 mg | ORAL_CAPSULE | Freq: Three times a day (TID) | ORAL | 2 refills | Status: DC | PRN

## 2020-02-12 NOTE — Assessment & Plan Note (Addendum)
Body mass index is 43.26 kg/m.  - trending up  Lab Results  Component Value Date   TSH 1.920 10/05/2019     Contributing to gerd risk >>> reviewed the need and the process to achieve and maintain neg calorie balance > defer f/u primary care including intermittently monitoring thyroid status            Each maintenance medication was reviewed in detail including emphasizing most importantly the difference between maintenance and prns and under what circumstances the prns are to be triggered using an action plan format where appropriate.  Total time for H and P, chart review, counseling,   and generating customized AVS unique to this office visit / same day charting = 46 min

## 2020-02-12 NOTE — Progress Notes (Signed)
Haley Mcgrath, female    DOB: [birth year], 60 y.o.   MRN: [id number]   Brief patient profile:  44 yobf never smoker always lived in [place] was told asthma as child and did fine in school then saw allergist in her 20's for recurrent cough does not remember findings/ recs but ever since then up to 2 x a year develops severe paroxysmal cough lasting up to a month  Typically "starts like a head cold" and goes into chest so referred to pulmonary clinic in   02/12/2020 by Dr   Haley Mcgrath      History of Present Illness  02/12/2020  Pulmonary/ 1st office eval/ Haley Mcgrath / [facility]  Chief Complaint  Patient presents with  . Consult    Had non productive cough for about a month which has no resolved  Dyspnea:  Not limited by breathing from desired activities  / works as Chief Strategy Officer nights  Cough: no  Sleep: able to sleep flat bed couple of pillows  SABA use: none now  No obvious day to day or daytime variability or assoc excess/ purulent sputum or mucus plugs or hemoptysis or cp or chest tightness, subjective wheeze or overt sinus or hb symptoms.   Sleeping most nights without nocturnal  or early am exacerbation  of respiratory  c/o's or need for noct saba. Also denies any obvious fluctuation of symptoms with weather or environmental changes or other aggravating or alleviating factors except as outlined above   No unusual exposure hx or h/o childhood pna  or knowledge of premature birth.  Current Allergies, Complete Past Medical History, Past Surgical History, Family History, and Social History were reviewed in [employer] record.  ROS  The following are not active complaints unless bolded Hoarseness, sore throat, dysphagia, dental problems, itching, sneezing,  nasal congestion or discharge of excess mucus or purulent secretions, ear ache,   fever, chills, sweats, unintended wt loss or wt gain, classically pleuritic or exertional cp,  orthopnea pnd or arm/hand  swelling  or leg swelling, presyncope, palpitations, abdominal pain, anorexia, nausea, vomiting, diarrhea  or change in bowel habits or change in bladder habits, change in stools or change in urine, dysuria, hematuria,  rash, arthralgias, visual complaints, headache, numbness, weakness or ataxia or problems with walking or coordination,  change in mood or  memory.           Past Medical History:  Diagnosis Date  . Arthritis    bilateral in hands  . Asthma    history of childhood asthma  . CTS (carpal tunnel syndrome)   . Elevated liver enzymes   . Fatty liver   . High cholesterol   . Hypertension     Outpatient Medications Prior to Visit  Medication Sig Dispense Refill  . albuterol (PROVENTIL HFA;VENTOLIN HFA) 108 (90 Base) MCG/ACT inhaler Inhale 2 puffs into the lungs every 4 (four) hours as needed for wheezing or shortness of breath. 1 Inhaler 0  . albuterol (PROVENTIL) (2.5 MG/3ML) 0.083% nebulizer solution Take 3 mLs (2.5 mg total) by nebulization every 6 (six) hours as needed for wheezing or shortness of breath. 150 mL 1  . allopurinol (ZYLOPRIM) 100 MG tablet TAKE 1 TABLET(100 MG) BY MOUTH DAILY 90 tablet 3  . benzonatate (TESSALON) 200 MG capsule Take 1 capsule (200 mg total) by mouth 2 (two) times daily as needed for cough. 20 capsule 0  . cyclobenzaprine (FLEXERIL) 10 MG tablet TAKE 1 TABLET BY MOUTH THREE TIMES DAILY  AS NEEDED FOR MUSCLE SPASMS 30 tablet 1  . fluticasone (FLONASE) 50 MCG/ACT nasal spray SHAKE LIQUID AND USE 2 SPRAYS IN EACH NOSTRIL DAILY 16 g 6  . Fluticasone-Salmeterol (ADVAIR DISKUS) 250-50 MCG/DOSE AEPB Not taking   3  . furosemide (LASIX) 20 MG tablet TAKE 1 TABLET(20 MG) BY MOUTH DAILY 90 tablet 3  . gabapentin (NEURONTIN) 300 MG capsule Take 1 capsule (300 mg total) by mouth 3 (three) times daily. (Patient taking differently: Take 300 mg by mouth 3 (three) times daily as needed. Hand pain) 90 capsule 11  . guaiFENesin-codeine 100-10 MG/5ML syrup Take 10  mLs by mouth every 6 (six) hours as needed. 180 mL 0  . ibuprofen (ADVIL) 800 MG tablet TAKE 1 TABLET BY MOUTH EVERY 8 HOURS AS NEEDED FOR PAIN 30 tablet 1  . metFORMIN (GLUCOPHAGE) 500 MG tablet Take 1 tablet (500 mg total) by mouth daily with breakfast. 90 tablet 1  . montelukast (SINGULAIR) 10 MG tablet Not taking   2  . Multiple Vitamins-Minerals (ALIVE WOMENS 50+ PO) Take 1 tablet by mouth daily.    . simvastatin (ZOCOR) 20 MG tablet TAKE 1 TABLET(20 MG) BY MOUTH DAILY 90 tablet 3  . cetirizine (ZYRTEC) 10 MG tablet Not taking      No facility-administered medications prior to visit.     Objective:     BP (!) 142/96 (BP Location: Left Arm, Cuff Size: Normal)   Pulse 75   Temp (!) 97.5 F (36.4 C) (Temporal)   Ht 5\' 7"  (1.702 m)   Wt 276 lb 3.2 oz (125.3 kg)   SpO2 100% Comment: Room air  BMI 43.26 kg/m   SpO2: 100 % (Room air)   Wt Readings from Last 3 Encounters:  02/12/20 276 lb 3.2 oz (125.3 kg)  12/18/19 272 lb (123.4 kg)  10/05/19 266 lb (120.7 kg)     Amb obese bf nad with occ vigorous throat clearing and classic pseudowheeze better with plm    HEENT : pt wearing mask not removed for exam due to covid -19 concerns.    NECK :  without JVD/Nodes/TM/ nl carotid upstrokes bilaterally   LUNGS: no acc muscle use,  Nl contour chest which is clear to A and P bilaterally without cough on insp or exp maneuvers   CV:  RRR  no s3 or murmur or increase in P2, and no edema   ABD: obese soft and nontender with nl inspiratory excursion in the supine position. No bruits or organomegaly appreciated, bowel sounds nl  MS:  Nl gait/ ext warm without deformities, calf tenderness, cyanosis or clubbing No obvious joint restrictions   SKIN: warm and dry without lesions    NEURO:  alert, approp, nl sensorium with  no motor or cerebellar deficits apparent.    CXR PA and Lateral:   02/12/2020 :    I personally reviewed images and agree with radiology impression as follows:    Eventration of the right hemidiaphragm Normal chest radiographs.  Labs ordered 02/12/2020  :  allergy profile      Assessment   Cough variant asthma vs uacs  Onset in her 20's with background of ? Asthma as child but no seasonal pattern -  Allergy profile 02/12/2020 >  Eos 0. /  IgE    - The proper method of use, as well as anticipated side effects, of a metered-dose inhaler were discussed and demonstrated to the patient using teach back method.  Adherence is always the initial "prime  suspect" in dtc asthma symptoms and is a multilayered concern that requires a "trust but verify" approach in every patient - starting with knowing how to use medications, especially inhalers, correctly, keeping up with refills and understanding the fundamental difference between maintenance and prns vs those medications only taken for a very short course and then stopped and not refilled.    - she is struggling with maint vs prns  So rec f/u with all meds in hand using a trust but verify approach to confirm accurate Medication  Reconciliation The principal here is that until we are certain that the  patients are doing what we've asked, it makes no sense to ask them to do more.   ? Acid (or non-acid) GERD > always difficult to exclude as up to 75% of pts in some series report no assoc GI/ Heartburn symptoms> rec max (24h)  acid suppression and diet restrictions/ reviewed and instructions given in writing.   Given the amt of pseudowheeze on exam now I strongly also suspect Upper airway cough syndrome (previously labeled PNDS),  is so named because it's frequently impossible to sort out how much is  CR/sinusitis with freq throat clearing (which can be related to primary GERD)   vs  causing  secondary (" extra esophageal")  GERD from wide swings in gastric pressure that occur with throat clearing, often  promoting self use of mint and menthol lozenges that reduce the lower esophageal sphincter tone and exacerbate the  problem further in a cyclical fashion.   These are the same pts (now being labeled as having "irritable larynx syndrome" by some cough centers) who not infrequently have a history of having failed to tolerate Mcgrath inhibitors,  dry powder inhalers (esp advair) or biphosphonates or report having atypical/extraesophageal reflux symptoms that don't respond to standard doses of PPI  and are easily confused as having aecopd or asthma flares by even experienced allergists/ pulmonologists (myself included).   rec Leave off advair and singulair for now (not really using anyway)  For wheeze > saba For cough > tessalon For pnds > 1st gen H1 blockers per guidelines   Max gerd rx/ diet  F/u in 6weeks, sooner if needed          Each maintenance medication was reviewed in detail including emphasizing most importantly the difference between maintenance and prns and under what circumstances the prns are to be triggered using an action plan format where appropriate.  Total time for H and P, chart review, counseling, reviewing hfa device(s) and generating customized AVS unique to this new pt  office visit / same day charting =         Morbid obesity due to excess calories (HCC) Body mass index is 43.26 kg/m.  - trending up  Lab Results  Component Value Date   TSH 1.920 10/05/2019     Contributing to gerd risk >>> reviewed the need and the process to achieve and maintain neg calorie balance > defer f/u primary care including intermittently monitoring thyroid status         Each maintenance medication was reviewed in detail including emphasizing most importantly the difference between maintenance and prns and under what circumstances the prns are to be triggered using an action plan format where appropriate.  Total time for H and P, chart review, counseling,   and generating customized AVS unique to this office visit / same day charting = 46 min         Sandrea Hughs, MD  02/12/2020    

## 2020-02-12 NOTE — Patient Instructions (Addendum)
Pantoprazole (protonix) 40 mg   Take  30-60 min before first meal of the day and Pepcid (famotidine)  20 mg one after supper  until return to office - this is the best way to tell whether stomach acid is contributing to your problem.    GERD (REFLUX)  is an extremely common cause of respiratory symptoms just like yours , many times with no obvious heartburn at all.    It can be treated with medication, but also with lifestyle changes including elevation of the head of your bed (ideally with 6 -8inch blocks under the headboard of your bed),  Smoking cessation, avoidance of late meals, excessive alcohol, and avoid fatty foods, chocolate, peppermint, colas, red wine, and acidic juices such as orange juice.  NO MINT OR MENTHOL PRODUCTS SO NO COUGH DROPS  USE SUGARLESS CANDY INSTEAD (Jolley ranchers or Stover's or Life Savers) or even ice chips will also do - the key is to swallow to prevent all throat clearing. NO OIL BASED VITAMINS - use powdered substitutes.  Avoid fish oil when coughing.  For breathing >>>Only use your albuterol as a rescue medication to be used if you can't catch your breath by resting or doing a relaxed purse lip breathing pattern.  - The less you use it, the better it will work when you need it. - Ok to use up to 2 puffs  every 4 hours if you must but call for immediate appointment if use goes up over your usual need - Don't leave home without it !!  (think of it like the spare tire for your car)   For coughing >  tessilon 200 mg up to three times a day  For drainage / throat tickle/sneezing  try take CHLORPHENIRAMINE  4 mg  (Chlortab 4mg   at should be easiest to find in the green box)  take one every 4 hours as needed - available over the counter- may cause drowsiness so start with just a bedtime dose or two and see how you tolerate it before trying in daytime      Please remember to go to the lab and x-ray department at [facility]   for your tests -  we will call you with the results when they are available.      Please schedule a follow up office visit in 6 weeks, call sooner if needed with all medications /inhalers/ solutions in hand so we can verify exactly what you are taking. This includes all medications from all doctors and over the counters - PLEASE separate them into two bags:  the ones you take automatically, no matter what, vs the ones you take just when you feel you need them "BAG #2 is UP TO YOU"  - this will really help [facility] help you take your medications more effectively.

## 2020-02-12 NOTE — Assessment & Plan Note (Signed)
Onset in her 20's with background of ? Asthma as child but no seasonal pattern -  Allergy profile 02/12/2020 >  Eos 0. /  IgE    - The proper method of use, as well as anticipated side effects, of a metered-dose inhaler were discussed and demonstrated to the patient using teach back method.  Adherence is always the initial "prime suspect" in dtc asthma symptoms and is a multilayered concern that requires a "trust but verify" approach in every patient - starting with knowing how to use medications, especially inhalers, correctly, keeping up with refills and understanding the fundamental difference between maintenance and prns vs those medications only taken for a very short course and then stopped and not refilled.    - she is struggling with maint vs prns  So rec f/u with all meds in hand using a trust but verify approach to confirm accurate Medication  Reconciliation The principal here is that until we are certain that the  patients are doing what we've asked, it makes no sense to ask them to do more.   ? Acid (or non-acid) GERD > always difficult to exclude as up to 75% of pts in some series report no assoc GI/ Heartburn symptoms> rec max (24h)  acid suppression and diet restrictions/ reviewed and instructions given in writing.   Given the amt of pseudowheeze on exam now I strongly also suspect Upper airway cough syndrome (previously labeled PNDS),  is so named because it's frequently impossible to sort out how much is  CR/sinusitis with freq throat clearing (which can be related to primary GERD)   vs  causing  secondary (" extra esophageal")  GERD from wide swings in gastric pressure that occur with throat clearing, often  promoting self use of mint and menthol lozenges that reduce the lower esophageal sphincter tone and exacerbate the problem further in a cyclical fashion.   These are the same pts (now being labeled as having "irritable larynx syndrome" by some cough centers) who not infrequently have  a history of having failed to tolerate ace inhibitors,  dry powder inhalers (esp advair) or biphosphonates or report having atypical/extraesophageal reflux symptoms that don't respond to standard doses of PPI  and are easily confused as having aecopd or asthma flares by even experienced allergists/ pulmonologists (myself included).   rec Leave off advair and singulair for now (not really using anyway)  For wheeze > saba For cough > tessalon For pnds > 1st gen H1 blockers per guidelines   Max gerd rx/ diet  F/u in 6weeks, sooner if needed          Each maintenance medication was reviewed in detail including emphasizing most importantly the difference between maintenance and prns and under what circumstances the prns are to be triggered using an action plan format where appropriate.  Total time for H and P, chart review, counseling, reviewing hfa device(s) and generating customized AVS unique to this new pt  office visit / same day charting =

## 2020-02-13 ENCOUNTER — Encounter: Payer: Self-pay | Admitting: Internal Medicine

## 2020-02-15 LAB — IGE: IgE (Immunoglobulin E), Serum: 43 IU/mL (ref 6–495)

## 2020-03-08 DIAGNOSIS — J9801 Acute bronchospasm: Secondary | ICD-10-CM | POA: Diagnosis not present

## 2020-03-08 DIAGNOSIS — Z20822 Contact with and (suspected) exposure to covid-19: Secondary | ICD-10-CM | POA: Diagnosis not present

## 2020-03-08 DIAGNOSIS — J069 Acute upper respiratory infection, unspecified: Secondary | ICD-10-CM | POA: Diagnosis not present

## 2020-03-15 ENCOUNTER — Other Ambulatory Visit: Payer: Self-pay | Admitting: Family Medicine

## 2020-03-25 ENCOUNTER — Ambulatory Visit: Payer: BC Managed Care – PPO | Admitting: Internal Medicine

## 2020-03-26 ENCOUNTER — Encounter: Payer: Self-pay | Admitting: Internal Medicine

## 2020-03-26 ENCOUNTER — Encounter: Payer: Self-pay | Admitting: *Deleted

## 2020-03-26 ENCOUNTER — Other Ambulatory Visit: Payer: Self-pay

## 2020-03-26 ENCOUNTER — Ambulatory Visit: Payer: BC Managed Care – PPO | Admitting: Internal Medicine

## 2020-03-26 ENCOUNTER — Telehealth: Payer: Self-pay | Admitting: Internal Medicine

## 2020-03-26 VITALS — BP 136/80 | HR 76 | Temp 97.6°F | Ht 67.0 in | Wt 275.0 lb

## 2020-03-26 DIAGNOSIS — J45991 Cough variant asthma: Secondary | ICD-10-CM

## 2020-03-26 MED ORDER — METHYLPREDNISOLONE ACETATE 80 MG/ML IJ SUSP
120.0000 mg | Freq: Once | INTRAMUSCULAR | Status: AC
  Administered 2020-03-26: 120 mg via INTRAMUSCULAR

## 2020-03-26 MED ORDER — MOMETASONE FURO-FORMOTEROL FUM 100-5 MCG/ACT IN AERO: INHALATION_SPRAY | RESPIRATORY_TRACT | 11 refills | Status: DC

## 2020-03-26 MED ORDER — GABAPENTIN 100 MG PO CAPS: 100.0000 mg | ORAL_CAPSULE | Freq: Four times a day (QID) | ORAL | 2 refills | Status: DC

## 2020-03-26 NOTE — Telephone Encounter (Signed)
Noted. Will route message to MW to make aware.   MW please advise. Patient is already receiving gabapentin via another provider.   Thanks :)

## 2020-03-26 NOTE — Telephone Encounter (Signed)
Called and spoke with Haley Mcgrath and she stated that she will cancel the 300 mg prescription and change it over to the 100 mg QID.  Nothing further is needed.

## 2020-03-26 NOTE — Progress Notes (Signed)
Haley Mcgrath, female    DOB: April 01, 1960, 60 y.o.   MRN: [id number]   Brief patient profile:  34 yobf never smoker always lived in [place] was told asthma as child and did fine in school then saw allergist in her 20's for recurrent cough does not remember findings/ recs but ever since then up to 2 x a year develops severe paroxysmal cough lasting up to a month  Typically "starts like a head cold" and goes into chest so referred to pulmonary clinic in [place]  02/12/2020 by Dr   Jeanice Lim      History of Present Illness  02/12/2020  Pulmonary/ 1st office eval/ [name] / [facility]  Chief Complaint  Patient presents with  . Consult    Had non productive cough for about a month which has no resolved  Dyspnea:  Not limited by breathing from desired activities  / works as Chief Strategy Officer nights  Cough: no  Sleep: able to sleep flat bed couple of pillows  SABA use: none now rec Pantoprazole (protonix) 40 mg   Take  30-60 min before first meal of the day and Pepcid (famotidine)  20 mg one after supper  until return to office - this is the best way to tell whether stomach acid is contributing to your problem.   GERD diet  For breathing >>>Only use your albuterol as a rescue medication to be used if you can't catch your breath  For coughing >  tessilon 200 mg up to three times a day For drainage / throat tickle/sneezing  try take CHLORPHENIRAMINE  4 mg     Please schedule a follow up office visit in 6 weeks, call sooner if needed with all medications /inhalers/ solutions   03/26/2020  f/u ov/[facility]/Whalen Trompeter re: cough variant asthma vs uacs  Chief Complaint  Patient presents with  . Follow-up    Cough has been worse since the last visit- non prod and triggered by strong scents. She is using her albuterol inhaler 1-2 x per day.   Dyspnea: better with saba  Cough: worse with activity and strong smells / non productive  Sleeping: bed is flat  SABA use: sev times a day thinks it may help  cough too 02: none  Covid status: Vax x 3  Lung cancer screening: n/a    No obvious day to day or daytime variability or assoc excess/ purulent sputum or mucus plugs or hemoptysis or cp or chest tightness, subjective wheeze or overt sinus or hb symptoms.   Sleeping ok  without nocturnal  or early am exacerbation  of respiratory  c/o's or need for noct saba. Also denies any obvious fluctuation of symptoms with weather or environmental changes or other aggravating or alleviating factors except as outlined above   No unusual exposure hx or h/o childhood pna   or knowledge of premature birth.  Current Allergies, Complete Past Medical History, Past Surgical History, Family History, and Social History were reviewed in [employer] record.  ROS  The following are not active complaints unless bolded Hoarseness, sore throat, dysphagia, dental problems, itching, sneezing,  nasal congestion or discharge of excess mucus or purulent secretions, ear ache,   fever, chills, sweats, unintended wt loss or wt gain, classically pleuritic or exertional cp,  orthopnea pnd or arm/hand swelling  or leg swelling, presyncope, palpitations, abdominal pain, anorexia, nausea, vomiting, diarrhea  or change in bowel habits or change in bladder habits, change in stools or change in urine,  dysuria, hematuria,  rash, arthralgias, visual complaints, headache, numbness, weakness or ataxia or problems with walking or coordination,  change in mood or  memory.        Current Meds  Medication Sig  . albuterol (PROVENTIL HFA;VENTOLIN HFA) 108 (90 Base) MCG/ACT inhaler Inhale 2 puffs into the lungs every 4 (four) hours as needed for wheezing or shortness of breath.  Marland Kitchen albuterol (PROVENTIL) (2.5 MG/3ML) 0.083% nebulizer solution Take 3 mLs (2.5 mg total) by nebulization every 6 (six) hours as needed for wheezing or shortness of breath.  . allopurinol (ZYLOPRIM) 100 MG tablet TAKE 1 TABLET(100 MG) BY MOUTH DAILY  .  benzonatate (TESSALON) 200 MG capsule Take 1 capsule (200 mg total) by mouth 3 (three) times daily as needed for cough.  . chlorpheniramine (CHLOR-TRIMETON) 4 MG tablet Take 4 mg by mouth every 4 (four) hours as needed for allergies.  . cyclobenzaprine (FLEXERIL) 10 MG tablet Take 10 mg by mouth 3 (three) times daily as needed for muscle spasms.  . famotidine (PEPCID) 20 MG tablet One after supper  . fluticasone (FLONASE) 50 MCG/ACT nasal spray SHAKE LIQUID AND USE 2 SPRAYS IN EACH NOSTRIL DAILY  . furosemide (LASIX) 20 MG tablet TAKE 1 TABLET(20 MG) BY MOUTH DAILY  . gabapentin (NEURONTIN) 300 MG capsule Not taking for hand pain as makes her too sleepy  . guaiFENesin-codeine 100-10 MG/5ML syrup Take 10 mLs by mouth every 6 (six) hours as needed.  Marland Kitchen ibuprofen (ADVIL) 800 MG tablet TAKE 1 TABLET BY MOUTH EVERY 8 HOURS AS NEEDED FOR PAIN  . metFORMIN (GLUCOPHAGE) 500 MG tablet Take 1 tablet (500 mg total) by mouth daily with breakfast.  . montelukast (SINGULAIR) 10 MG tablet Take 10 mg by mouth at bedtime.  . pantoprazole (PROTONIX) 40 MG tablet Take 1 tablet (40 mg total) by mouth daily. Take 30-60 min before first meal of the day  . simvastatin (ZOCOR) 20 MG tablet TAKE 1 TABLET(20 MG) BY MOUTH DAILY               Past Medical History:  Diagnosis Date  . Arthritis    bilateral in hands  . Asthma    history of childhood asthma  . CTS (carpal tunnel syndrome)   . Elevated liver enzymes   . Fatty liver   . High cholesterol   . Hypertension          Objective:         03/26/2020       275  02/12/20 276 lb 3.2 oz (125.3 kg)  12/18/19 272 lb (123.4 kg)  10/05/19 266 lb (120.7 kg)    Vital signs reviewed  03/26/2020  - Note at rest 02 sats  100% on RA   General appearance:    amb obese bf with classic pseudowheeze    HEENT : pt wearing mask not removed for exam due to covid -19 concerns.    NECK :  without JVD/Nodes/TM/ nl carotid upstrokes bilaterally   LUNGS: no acc  muscle use,  Nl contour chest which is clear to A and P bilaterally without cough on insp or exp maneuvers   CV:  RRR  no s3 or murmur or increase in P2, and no edema   ABD: obese  soft and nontender with nl inspiratory excursion in the supine position. No bruits or organomegaly appreciated, bowel sounds nl  MS:  Nl gait/ ext warm without deformities, calf tenderness, cyanosis or clubbing No obvious joint restrictions  SKIN: warm and dry without lesions    NEURO:  alert, approp, nl sensorium with  no motor or cerebellar deficits apparent.         Assessment

## 2020-03-26 NOTE — Patient Instructions (Addendum)
Dulera 100  Take 2 puffs first thing in am and then another 2 puffs about 12 hours later.   Work on inhaler technique:  relax and gently blow all the way out then take a nice smooth deep breath back in, triggering the inhaler at same time you start breathing in.  Hold for up to 5 seconds if you can. Blow out thru nose. Rinse and gargle with water when done      Only use your albuterol as a rescue medication to be used if you can't catch your breath by resting or doing a relaxed purse lip breathing pattern.  - The less you use it, the better it will work when you need it. - Ok to use up to 2 puffs  every 4 hours if you must but call for immediate appointment if use goes up over your usual need - Don't leave home without it !!  (think of it like the spare tire for your car)    Eliminate the cough x 3 straight days/night with the codeine syrup  then as needed   Gabapentin 100 mg  Four times a day until return   Chlorpheniramine 4 mg take 2 at bedtime   Depomerol 120 mg IM     Please schedule a follow up office visit in 2 weeks, sooner if needed - bring inhalers

## 2020-03-26 NOTE — Telephone Encounter (Signed)
She isn't taking the 300 due to dose dependent side effects so I rechallenged with 100 mg qid  - d/c the other rx for the 300

## 2020-03-26 NOTE — Assessment & Plan Note (Addendum)
Onset in her 20's with background of ? Asthma as child but no seasonal pattern -  Allergy profile 02/12/2020 >  Eos 0.3 /  IgE 43   - 03/26/2020  After extensive coaching inhaler device,  effectiveness =    75% > try dulera 100 2bid   The reported response to saba suggests asthma and she does have some hx to support this but mostly upper airway "wheeze" on exam today so rec   Eliminate the cough x 3 straight days/night with the codeine syrup  then as needed   Gabapentin 100 mg  Four times a day until return (can't tol the 300 strength)  Chlorpheniramine 4 mg take 2 at bedtime and up to q 4 h prn daytime  Per 1st gen H1 blockers per guidelines   Add codeine containing cough syrup prn     f/u in 2 weeks with all inhalers          Each maintenance medication was reviewed in detail including emphasizing most importantly the difference between maintenance and prns and under what circumstances the prns are to be triggered using an action plan format where appropriate.  Total time for H and P, chart review, counseling, reviewing hfa device(s) and generating customized AVS unique to this office visit / same day charting > 30 min

## 2020-04-09 ENCOUNTER — Other Ambulatory Visit: Payer: Self-pay

## 2020-04-09 ENCOUNTER — Encounter: Payer: Self-pay | Admitting: Internal Medicine

## 2020-04-09 ENCOUNTER — Ambulatory Visit (INDEPENDENT_AMBULATORY_CARE_PROVIDER_SITE_OTHER): Payer: BC Managed Care – PPO | Admitting: Internal Medicine

## 2020-04-09 DIAGNOSIS — J45991 Cough variant asthma: Secondary | ICD-10-CM | POA: Diagnosis not present

## 2020-04-09 NOTE — Patient Instructions (Addendum)
Work on inhaler technique:  relax and gently blow all the way out then take a nice smooth deep breath back in, triggering the inhaler at same time you start breathing in.  Hold for up to 5 seconds if you can. Blow dulera  out thru nose. Rinse and gargle with water when done     ok to try dulera 100 one twice daily - first dose is first thing in am with a 100 mg of gabapentin   If not better after a few weeks  the next step is add another gabapentin 100 mg at bedtime    Please schedule a follow up visit in 3 months but call sooner if needed

## 2020-04-09 NOTE — Assessment & Plan Note (Addendum)
Onset in her 20's with background of ? Asthma as child but no seasonal pattern -  Allergy profile 02/12/2020 >  Eos 0.3 /  IgE 43   - 03/26/2020    try dulera 100 2bid  - 04/09/2020  After extensive coaching inhaler device,  effectiveness =    75% try dulera 100 one bid   Throat clearing is really more of an issue than sob/ wheeze/ noct cough which means the asthma component is probably less than the component of Upper airway cough syndrome (previously labeled PNDS),  is so named because it's frequently impossible to sort out how much is  CR/sinusitis with freq throat clearing (which can be related to primary GERD)   vs  causing  secondary (" extra esophageal")  GERD from wide swings in gastric pressure that occur with throat clearing, often  promoting self use of mint and menthol lozenges that reduce the lower esophageal sphincter tone and exacerbate the problem further in a cyclical fashion.   These are the same pts (now being labeled as having "irritable larynx syndrome" by some cough centers) who not infrequently have a history of having failed to tolerate ace inhibitors,  dry powder inhalers or biphosphonates or report having atypical/extraesophageal reflux symptoms that don't respond to standard doses of PPI  and are easily confused as having aecopd or asthma flares by even experienced allergists/ pulmonologists (myself included).   If not better to her satisfaction plan to titrate the gabapentin gradually up to 200 qid.         Each maintenance medication was reviewed in detail including emphasizing most importantly the difference between maintenance and prns and under what circumstances the prns are to be triggered using an action plan format where appropriate.  Total time for H and P, chart review, counseling, reviewing hfa device(s) and generating customized AVS unique to this office visit / same day charting = 20  Min

## 2020-04-09 NOTE — Progress Notes (Signed)
Haley Mcgrath, female    DOB: May 27, 1960, 60 y.o.   MRN: [id number]   Brief patient profile:  25 yobf never smoker always lived in [place] was told asthma as child and did fine in school then saw allergist in her 20's for recurrent cough does not remember findings/ recs but ever since then up to 2 x a year develops severe paroxysmal cough lasting up to a month  Typically "starts like a head cold" and goes into chest so referred to pulmonary clinic in [place]  02/12/2020 by Dr   Jeanice Lim      History of Present Illness  02/12/2020  Pulmonary/ 1st office eval/ Haley Mcgrath / Sales executive Complaint  Patient presents with   Consult    Had non productive cough for about a month which has no resolved  Dyspnea:  Not limited by breathing from desired activities  / works as Chief Strategy Officer nights  Cough: no  Sleep: able to sleep flat bed couple of pillows  SABA use: none now rec Pantoprazole (protonix) 40 mg   Take  30-60 min before first meal of the day and Pepcid (famotidine)  20 mg one after supper  until return to office - this is the best way to tell whether stomach acid is contributing to your problem.   GERD diet  For breathing >>>Only use your albuterol as a rescue medication to be used if you can't catch your breath  For coughing >  tessilon 200 mg up to three times a day For drainage / throat tickle/sneezing  try take CHLORPHENIRAMINE  4 mg     Please schedule a follow up office visit in 6 weeks, call sooner if needed with all medications /inhalers/ solutions   03/26/2020  f/u ov/[place] [facility] office/Haley Mcgrath re: cough variant asthma vs uacs  Chief Complaint  Patient presents with   Follow-up    Cough has been worse since the last visit- non prod and triggered by strong scents. She is using her albuterol inhaler 1-2 x per day.   Dyspnea: better with saba  Cough: worse with activity and strong smells / non productive  Sleeping: bed is flat  SABA use: sev times a day thinks it may help  cough too 02: none  Covid status: Vax x 3  rec  Dulera 100  Take 2 puffs first thing in am and then another 2 puffs about 12 hours later.  Work on inhaler technique:   Only use your albuterol as a rescue medication Eliminate the cough x 3 straight days/night with the codeine syrup  then as needed  Gabapentin 100 mg  Four times a day until return  Chlorpheniramine 4 mg take 2 at bedtime  Depomerol 120 mg IM      04/09/2020  f/u ov/[facility]/Haley Mcgrath re: cough variant asthma with uacs  Chief Complaint  Patient presents with   Follow-up    Cough has improved some since the last visit. She does still clear her throat occ. She has not used her albuterol inhaler or neb.    Dyspnea: Not limited by breathing from desired activities   Cough: worse after get up but doesn't take gabapentin  Sleeping: flat bed with pillows  SABA use: none  02: none d Covid status:  vax x 3  Lung cancer screening: n/a    No obvious day to day or daytime variability or assoc excess/ purulent sputum or mucus plugs or hemoptysis or cp or chest tightness, subjective wheeze or overt sinus  or hb symptoms.   Sleeping  without nocturnal  or early am exacerbation  of respiratory  c/o's or need for noct saba. Also denies any obvious fluctuation of symptoms with weather or environmental changes or other aggravating or alleviating factors except as outlined above   No unusual exposure hx or h/o childhood pna  or knowledge of premature birth.  Current Allergies, Complete Past Medical History, Past Surgical History, Family History, and Social History were reviewed in [employer] record.  ROS  The following are not active complaints unless bolded Hoarseness, sore throat, dysphagia, dental problems, itching, sneezing,  nasal congestion or discharge of excess mucus or purulent secretions, ear ache,   fever, chills, sweats, unintended wt loss or wt gain, classically pleuritic or exertional cp,   orthopnea pnd or arm/hand swelling  or leg swelling, presyncope, palpitations, abdominal pain, anorexia, nausea, vomiting, diarrhea  or change in bowel habits or change in bladder habits, change in stools or change in urine, dysuria, hematuria,  rash, arthralgias, visual complaints, headache, numbness, weakness or ataxia or problems with walking or coordination,  change in mood or  memory.        Current Meds  Medication Sig   albuterol (PROVENTIL HFA;VENTOLIN HFA) 108 (90 Base) MCG/ACT inhaler Inhale 2 puffs into the lungs every 4 (four) hours as needed for wheezing or shortness of breath.   albuterol (PROVENTIL) (2.5 MG/3ML) 0.083% nebulizer solution Take 3 mLs (2.5 mg total) by nebulization every 6 (six) hours as needed for wheezing or shortness of breath.   allopurinol (ZYLOPRIM) 100 MG tablet TAKE 1 TABLET(100 MG) BY MOUTH DAILY   chlorpheniramine (CHLOR-TRIMETON) 4 MG tablet Take 4 mg by mouth every 4 (four) hours as needed for allergies.   cyclobenzaprine (FLEXERIL) 10 MG tablet Take 10 mg by mouth 3 (three) times daily as needed for muscle spasms.   famotidine (PEPCID) 20 MG tablet One after supper   fluticasone (FLONASE) 50 MCG/ACT nasal spray SHAKE LIQUID AND USE 2 SPRAYS IN EACH NOSTRIL DAILY   furosemide (LASIX) 20 MG tablet TAKE 1 TABLET(20 MG) BY MOUTH DAILY   gabapentin (NEURONTIN) 100 MG capsule Take 1 capsule (100 mg total) by mouth 4 (four) times daily.   guaiFENesin-codeine 100-10 MG/5ML syrup Take 10 mLs by mouth every 6 (six) hours as needed.   ibuprofen (ADVIL) 800 MG tablet TAKE 1 TABLET BY MOUTH EVERY 8 HOURS AS NEEDED FOR PAIN   metFORMIN (GLUCOPHAGE) 500 MG tablet Take 1 tablet (500 mg total) by mouth daily with breakfast.   mometasone-formoterol (DULERA) 100-5 MCG/ACT AERO Take 2 puffs first thing in am and then another 2 puffs about 12 hours later.   montelukast (SINGULAIR) 10 MG tablet Take 10 mg by mouth at bedtime.   pantoprazole (PROTONIX) 40 MG  tablet Take 1 tablet (40 mg total) by mouth daily. Take 30-60 min before first meal of the day   simvastatin (ZOCOR) 20 MG tablet TAKE 1 TABLET(20 MG) BY MOUTH DAILY                     Past Medical History:  Diagnosis Date   Arthritis    bilateral in hands   Asthma    history of childhood asthma   CTS (carpal tunnel syndrome)    Elevated liver enzymes    Fatty liver    High cholesterol    Hypertension          Objective:       04/09/2020  267   03/26/2020       275  02/12/20 276 lb 3.2 oz (125.3 kg)  12/18/19 272 lb (123.4 kg)  10/05/19 266 lb (120.7 kg)    Vital signs reviewed  04/09/2020  - Note at rest 02 sats  99% on RA   General appearance:    amb pleasant obese bf nad occ throat clearing    HEENT : pt wearing mask not removed for exam due to covid -19 concerns.    NECK :  without JVD/Nodes/TM/ nl carotid upstrokes bilaterally   LUNGS: no acc muscle use,  Nl contour chest which is clear to A and P bilaterally without cough on insp or exp maneuvers   CV:  RRR  no s3 or murmur or increase in P2, and no edema   ABD:  Obese soft and nontender with nl inspiratory excursion in the supine position. No bruits or organomegaly appreciated, bowel sounds nl  MS:  Nl gait/ ext warm without deformities, calf tenderness, cyanosis or clubbing No obvious joint restrictions   SKIN: warm and dry without lesions    NEURO:  alert, approp, nl sensorium with  no motor or cerebellar deficits apparent.              Assessment

## 2020-05-06 ENCOUNTER — Telehealth: Payer: Self-pay | Admitting: Internal Medicine

## 2020-05-06 MED ORDER — PREDNISONE 10 MG PO TABS: ORAL_TABLET | ORAL | 0 refills | Status: DC

## 2020-05-06 MED ORDER — GABAPENTIN 100 MG PO CAPS: 200.0000 mg | ORAL_CAPSULE | Freq: Four times a day (QID) | ORAL | 3 refills | Status: DC

## 2020-05-06 MED ORDER — GUAIFENESIN-CODEINE 100-10 MG/5ML PO SOLN
10.0000 mL | ORAL | 0 refills | Status: DC | PRN
Start: 2020-05-06 — End: 2022-06-05

## 2020-05-06 NOTE — Telephone Encounter (Signed)
Increased cough x 10 days- non prod  She states she coughs until she feels she is being strangled  She is also wheezing  Not having any SOB unless she is having coughing spell  She denies f/c/s, aches  Has had covid vax x 3  She is taking her Dulera bid and gabapentin 100 mg qid  Please advise thanks

## 2020-05-06 NOTE — Telephone Encounter (Signed)
Spoke with the pt  Made aware of recs per MW  She verbalized understanding  Rx was sent to pharm  She will call for ov if not improving

## 2020-05-06 NOTE — Telephone Encounter (Signed)
Increased gabapentin to 200 mg qid   Prednisone 10 mg take  4 each am x 2 days,   2 each am x 2 days,  1 each am x 2 days and stop   I refilled the codeine cough syrup

## 2020-05-14 ENCOUNTER — Other Ambulatory Visit: Payer: Self-pay | Admitting: Internal Medicine

## 2020-06-01 DIAGNOSIS — M7652 Patellar tendinitis, left knee: Secondary | ICD-10-CM | POA: Diagnosis not present

## 2020-06-01 DIAGNOSIS — Z6841 Body Mass Index (BMI) 40.0 and over, adult: Secondary | ICD-10-CM | POA: Diagnosis not present

## 2020-06-01 DIAGNOSIS — M25562 Pain in left knee: Secondary | ICD-10-CM | POA: Diagnosis not present

## 2020-06-13 ENCOUNTER — Other Ambulatory Visit: Payer: Self-pay | Admitting: Family Medicine

## 2020-06-26 ENCOUNTER — Other Ambulatory Visit: Payer: Self-pay | Admitting: Internal Medicine

## 2020-07-05 ENCOUNTER — Ambulatory Visit: Payer: BC Managed Care – PPO | Admitting: Internal Medicine

## 2020-07-17 ENCOUNTER — Other Ambulatory Visit: Payer: Self-pay | Admitting: *Deleted

## 2020-07-17 MED ORDER — FUROSEMIDE 20 MG PO TABS: ORAL_TABLET | ORAL | 0 refills | Status: AC

## 2020-07-25 ENCOUNTER — Other Ambulatory Visit: Payer: Self-pay

## 2020-07-25 ENCOUNTER — Encounter: Payer: Self-pay | Admitting: Internal Medicine

## 2020-07-25 ENCOUNTER — Ambulatory Visit: Payer: BC Managed Care – PPO | Admitting: Internal Medicine

## 2020-07-25 DIAGNOSIS — J45991 Cough variant asthma: Secondary | ICD-10-CM

## 2020-07-25 MED ORDER — METHYLPREDNISOLONE ACETATE 80 MG/ML IJ SUSP
120.0000 mg | Freq: Once | INTRAMUSCULAR | Status: AC
  Administered 2020-07-25: 120 mg via INTRAMUSCULAR

## 2020-07-25 NOTE — Progress Notes (Signed)
Haley Mcgrath, female    DOB: 26-Feb-1960, 60 y.o.   MRN: [id number]   Brief patient profile:  58 yobf never smoker always lived in [place] was told asthma as child and did fine in school then saw allergist in her 20's for recurrent cough does not remember findings/ recs but ever since then up to 2 x a year develops severe paroxysmal cough lasting up to a month  Typically "starts like a head cold" and goes into chest so referred to pulmonary clinic in [place]  02/12/2020 by Dr   Jeanice Lim      History of Present Illness  02/12/2020  Pulmonary/ 1st office eval/ Shontelle Muska / Sales executive Complaint  Patient presents with   Consult    Had non productive cough for about a month which has no resolved  Dyspnea:  Not limited by breathing from desired activities  / works as Chief Strategy Officer nights  Cough: no  Sleep: able to sleep flat bed couple of pillows  SABA use: none now rec Pantoprazole (protonix) 40 mg   Take  30-60 min before first meal of the day and Pepcid (famotidine)  20 mg one after supper  until return to office - this is the best way to tell whether stomach acid is contributing to your problem.   GERD diet  For breathing >>>Only use your albuterol as a rescue medication to be used if you can't catch your breath  For coughing >  tessilon 200 mg up to three times a day For drainage / throat tickle/sneezing  try take CHLORPHENIRAMINE  4 mg     Please schedule a follow up office visit in 6 weeks, call sooner if needed with all medications /inhalers/ solutions   03/26/2020  f/u ov/[place] office/Minah Axelrod re: cough variant asthma vs uacs  Chief Complaint  Patient presents with   Follow-up    Cough has been worse since the last visit- non prod and triggered by strong scents. She is using her albuterol inhaler 1-2 x per day.   Dyspnea: better with saba  Cough: worse with activity and strong smells / non productive  Sleeping: bed is flat  SABA use: sev times a day thinks it may help  cough too 02: none  Covid status: Vax x 3  rec Dulera 100  Take 2 puffs first thing in am and then another 2 puffs about 12 hours later.  Work on inhaler technique:   Only use your albuterol as a rescue medication Eliminate the cough x 3 straight days/night with the codeine syrup  then as needed  Gabapentin 100 mg  Four times a day until return  Chlorpheniramine 4 mg take 2 at bedtime  Depomerol 120 mg IM      04/09/2020  f/u ov/[place] office/Elynor Kallenberger re: cough variant asthma with uacs  Chief Complaint  Patient presents with   Follow-up    Cough has improved some since the last visit. She does still clear her throat occ. She has not used her albuterol inhaler or neb.    Dyspnea: Not limited by breathing from desired activities   Cough: worse after get up but doesn't take gabapentin  Sleeping: flat bed with pillows  SABA use: none  02: none   Covid status:  vax x 3  Lung cancer screening: n/a   Rec Work on inhaler technique ok to try dulera 100 one twice daily - first dose is first thing in am with a 100 mg of gabapentin  If  not better after a few weeks  the next step is add another gabapentin 100 mg at bedtime  >>>  cough completely stopped  p around April 2022  while on gabapentin 100 qid    Caught covid with first symptoms at end of May 2022 > flare  of cough while still on gabapentin and dulera 100 one bid and gerd rx     07/25/2020  f/u ov/[place] office/Brit Wernette re: cough variant asthma vs uacs  Chief Complaint  Patient presents with   Follow-up    3 month for cough, tested positive 07/08/20 for Covid, Cough is worse in last 3 weeks dry, nagging cough, non productive,   Dyspnea:  Not limited by breathing from desired activities   Cough: mostly daytime dry now   Sleeping: ok p 2 chlortab hs  SABA use: not needing  02: none  Covid status: x 3 vax and omicron variant      No obvious day to day or daytime variability or assoc excess/ purulent sputum or mucus plugs or  hemoptysis or cp or chest tightness, subjective wheeze or overt sinus or hb symptoms.   Sleeping as above  without nocturnal  or early am exacerbation  of respiratory  c/o's or need for noct saba. Also denies any obvious fluctuation of symptoms with weather or environmental changes or other aggravating or alleviating factors except as outlined above   No unusual exposure hx or h/o childhood pna/ asthma or knowledge of premature birth.  Current Allergies, Complete Past Medical History, Past Surgical History, Family History, and Social History were reviewed in [employer] [facility] record.  ROS  The following are not active complaints unless bolded Hoarseness, sore throat, dysphagia, dental problems, itching, sneezing,  nasal congestion or discharge of excess mucus or purulent secretions, ear ache,   fever, chills, sweats, unintended wt loss or wt gain, classically pleuritic or exertional cp,  orthopnea pnd or arm/hand swelling  or leg swelling, presyncope, palpitations, abdominal pain, anorexia, nausea, vomiting, diarrhea  or change in bowel habits or change in bladder habits, change in stools or change in urine, dysuria, hematuria,  rash, arthralgias, visual complaints, headache, numbness, weakness or ataxia or problems with walking or coordination,  change in mood or  memory.        Current Meds  Medication Sig   albuterol (PROVENTIL HFA;VENTOLIN HFA) 108 (90 Base) MCG/ACT inhaler Inhale 2 puffs into the lungs every 4 (four) hours as needed for wheezing or shortness of breath.   albuterol (PROVENTIL) (2.5 MG/3ML) 0.083% nebulizer solution Take 3 mLs (2.5 mg total) by nebulization every 6 (six) hours as needed for wheezing or shortness of breath.   allopurinol (ZYLOPRIM) 100 MG tablet TAKE 1 TABLET(100 MG) BY MOUTH DAILY   chlorpheniramine (CHLOR-TRIMETON) 4 MG tablet Take 4 mg by mouth every 4 (four) hours as needed for allergies.   cyclobenzaprine (FLEXERIL) 10 MG tablet Take 10 mg  by mouth 3 (three) times daily as needed for muscle spasms.   famotidine (PEPCID) 20 MG tablet One after supper   fluticasone (FLONASE) 50 MCG/ACT nasal spray SHAKE LIQUID AND USE 2 SPRAYS IN EACH NOSTRIL DAILY   furosemide (LASIX) 20 MG tablet TAKE 1 TABLET(20 MG) BY MOUTH DAILY   gabapentin (NEURONTIN) 100 MG capsule Take 1 capsule (100 mg total) by mouth 4 (four) times daily.   gabapentin (NEURONTIN) 100 MG capsule Take 2 capsules (200 mg total) by mouth 4 (four) times daily.   guaiFENesin-codeine 100-10 MG/5ML syrup Take  10 mLs by mouth every 4 (four) hours as needed.   ibuprofen (ADVIL) 800 MG tablet TAKE 1 TABLET BY MOUTH EVERY 8 HOURS AS NEEDED FOR PAIN   metFORMIN (GLUCOPHAGE) 500 MG tablet TAKE 1 TABLET(500 MG) BY MOUTH DAILY WITH BREAKFAST   mometasone-formoterol (DULERA) 100-5 MCG/ACT AERO Take 2 puffs first thing in am and then another 2 puffs about 12 hours later.   montelukast (SINGULAIR) 10 MG tablet Take 10 mg by mouth at bedtime.   pantoprazole (PROTONIX) 40 MG tablet TAKE 1 TABLET(40 MG) BY MOUTH DAILY 30 TO 60 MINUTES BEFORE FIRST MEAL OF THE DAY   simvastatin (ZOCOR) 20 MG tablet TAKE 1 TABLET(20 MG) BY MOUTH DAILY                         Past Medical History:  Diagnosis Date   Arthritis    bilateral in hands   Asthma    history of childhood asthma   CTS (carpal tunnel syndrome)    Elevated liver enzymes    Fatty liver    High cholesterol    Hypertension          Objective:       04/09/2020          267   03/26/2020       275  02/12/20 276 lb 3.2 oz (125.3 kg)  12/18/19 272 lb (123.4 kg)  10/05/19 266 lb (120.7 kg)       Vital signs reviewed  07/25/2020  - Note at rest 02 sats  100% on RA   General appearance:    amb bf nad  with occ dry coughing     HEENT : pt wearing mask not removed for exam due to covid -19 concerns.    NECK :  without JVD/Nodes/TM/ nl carotid upstrokes bilaterally   LUNGS: no acc muscle use,  Nl contour chest which  is clear to A and P bilaterally without cough on insp or exp maneuvers   CV:  RRR  no s3 or murmur or increase in P2, and no edema   ABD:  obese soft and nontender with nl inspiratory excursion in the supine position. No bruits or organomegaly appreciated, bowel sounds nl  MS:  Nl gait/ ext warm without deformities, calf tenderness, cyanosis or clubbing No obvious joint restrictions   SKIN: warm and dry without lesions    NEURO:  alert, approp, nl sensorium with  no motor or cerebellar deficits apparent.               Assessment

## 2020-07-25 NOTE — Assessment & Plan Note (Addendum)
Onset in her 20's with background of ? Asthma as child but no seasonal pattern -  Allergy profile 02/12/2020 >  Eos 0.3 /  IgE 43   - 03/26/2020    try dulera 100 2bid  - 04/09/2020  After extensive coaching inhaler device,  effectiveness =    75% try dulera 100 one bid plus build up gabapentin to 100 qid > much better until covid early June 2022 despite same rx plus gerd rx    Absence of cough during sleep or reproduction with insp / exp favors uacs triggered by viral urin  Upper airway cough syndrome (previously labeled PNDS),  is so named because it's frequently impossible to sort out how much is  CR/sinusitis with freq throat clearing (which can be related to primary GERD)   vs  causing  secondary (" extra esophageal")  GERD from wide swings in gastric pressure that occur with throat clearing, often  promoting self use of mint and menthol lozenges that reduce the lower esophageal sphincter tone and exacerbate the problem further in a cyclical fashion.   These are the same pts (now being labeled as having "irritable larynx syndrome" by some cough centers) who not infrequently have a history of having failed to tolerate ace inhibitors,  dry powder inhalers or biphosphonates or report having atypical/extraesophageal reflux symptoms that don't respond to standard doses of PPI  and are easily confused as having aecopd or asthma flares by even experienced allergists/ pulmonologists (myself included).   Rec:  depomedrol 120 mg IM  deslym for cough  Increase gabapentin to total of 600 mg daily if not improving  F/u a 3 m          Each maintenance medication was reviewed in detail including emphasizing most importantly the difference between maintenance and prns and under what circumstances the prns are to be triggered using an action plan format where appropriate.  Total time for H and P, chart review, counseling, reviewing hfa  device(s) and generating customized AVS unique to this office visit /  same day charting = 33 min

## 2020-07-25 NOTE — Patient Instructions (Addendum)
Depomedrol 120 mg IM  today   For cough > delsym 2 tsp every 12 hours as needed   If not happy ok to take an extra dose or two daytime doses of gabapentin 100  Please schedule a follow up visit in 3 months but call sooner if needed

## 2020-09-18 ENCOUNTER — Other Ambulatory Visit: Payer: Self-pay | Admitting: Family Medicine

## 2020-09-18 DIAGNOSIS — J40 Bronchitis, not specified as acute or chronic: Secondary | ICD-10-CM | POA: Diagnosis not present

## 2020-10-24 ENCOUNTER — Ambulatory Visit: Payer: BC Managed Care – PPO | Admitting: Internal Medicine

## 2020-11-18 ENCOUNTER — Other Ambulatory Visit (HOSPITAL_COMMUNITY): Payer: Self-pay | Admitting: Family Medicine

## 2020-11-18 DIAGNOSIS — Z1231 Encounter for screening mammogram for malignant neoplasm of breast: Secondary | ICD-10-CM

## 2020-11-18 DIAGNOSIS — K219 Gastro-esophageal reflux disease without esophagitis: Secondary | ICD-10-CM | POA: Diagnosis not present

## 2020-11-18 DIAGNOSIS — E785 Hyperlipidemia, unspecified: Secondary | ICD-10-CM | POA: Diagnosis not present

## 2020-11-18 DIAGNOSIS — J45909 Unspecified asthma, uncomplicated: Secondary | ICD-10-CM | POA: Diagnosis not present

## 2020-11-18 DIAGNOSIS — M109 Gout, unspecified: Secondary | ICD-10-CM | POA: Diagnosis not present

## 2020-12-06 ENCOUNTER — Encounter: Payer: Self-pay | Admitting: Internal Medicine

## 2020-12-06 ENCOUNTER — Ambulatory Visit: Payer: BC Managed Care – PPO | Admitting: Internal Medicine

## 2020-12-06 ENCOUNTER — Other Ambulatory Visit: Payer: Self-pay

## 2020-12-06 DIAGNOSIS — J45991 Cough variant asthma: Secondary | ICD-10-CM

## 2020-12-06 DIAGNOSIS — R7303 Prediabetes: Secondary | ICD-10-CM | POA: Diagnosis not present

## 2020-12-06 DIAGNOSIS — E785 Hyperlipidemia, unspecified: Secondary | ICD-10-CM | POA: Diagnosis not present

## 2020-12-06 NOTE — Progress Notes (Signed)
Haley Mcgrath, female    DOB: 1960/06/06, 60 y.o.   MRN: [id number]   Brief patient profile:  48 yobf never smoker always lived in [place] was told asthma as child and did fine in school then saw allergist in her 20's for recurrent cough does not remember findings/ recs but ever since then up to 2 x a year develops severe paroxysmal cough lasting up to a month  Typically "starts like a head cold" and goes into chest so referred to pulmonary clinic in [place]  02/12/2020 by Dr   Buelah Manis      History of Present Illness  02/12/2020  Pulmonary/ 1st office eval/ Haley Mcgrath / Customer service manager Complaint  Patient presents with   Consult    Had non productive cough for about a month which has no resolved  Dyspnea:  Not limited by breathing from desired activities  / works as Primary school teacher nights  Cough: no  Sleep: able to sleep flat bed couple of pillows  SABA use: none now rec Pantoprazole (protonix) 40 mg   Take  30-60 min before first meal of the day and Pepcid (famotidine)  20 mg one after supper  until return to office - this is the best way to tell whether stomach acid is contributing to your problem.   GERD diet  For breathing >>>Only use your albuterol as a rescue medication to be used if you can't catch your breath  For coughing >  tessilon 200 mg up to three times a day For drainage / throat tickle/sneezing  try take CHLORPHENIRAMINE  4 mg     Please schedule a follow up office visit in 6 weeks, call sooner if needed with all medications /inhalers/ solutions   03/26/2020  f/u ov/[place] office/Haley Mcgrath re: cough variant asthma vs uacs  Chief Complaint  Patient presents with   Follow-up    Cough has been worse since the last visit- non prod and triggered by strong scents. She is using her albuterol inhaler 1-2 x per day.   Dyspnea: better with saba  Cough: worse with activity and strong smells / non productive  Sleeping: bed is flat  SABA use: sev times a day thinks it may help  cough too 02: none  Covid status: Vax x 3  rec Dulera 100  Take 2 puffs first thing in am and then another 2 puffs about 12 hours later.  Work on inhaler technique:   Only use your albuterol as a rescue medication Eliminate the cough x 3 straight days/night with the codeine syrup  then as needed  Gabapentin 100 mg  Four times a day until return  Chlorpheniramine 4 mg take 2 at bedtime  Depomerol 120 mg IM      04/09/2020  f/u ov/[place] office/Haley Mcgrath re: cough variant asthma with uacs  Chief Complaint  Patient presents with   Follow-up    Cough has improved some since the last visit. She does still clear her throat occ. She has not used her albuterol inhaler or neb.    Dyspnea: Not limited by breathing from desired activities   Cough: worse after get up but doesn't take gabapentin  Sleeping: flat bed with pillows  SABA use: none  02: none   Covid status:  vax x 3  Lung cancer screening: n/a   Rec Work on inhaler technique ok to try dulera 100 one twice daily - first dose is first thing in am with a 100 mg of gabapentin  If  not better after a few weeks  the next step is add another gabapentin 100 mg at bedtime  >>>  cough completely stopped  p around April 2022  while on gabapentin 100 qid    Caught covid with first symptoms at end of May 2022 > flare  of cough while still on gabapentin and dulera 100 one bid and gerd rx     07/25/2020  f/u ov/[place] office/Haley Mcgrath re: cough variant asthma vs uacs  Chief Complaint  Patient presents with   Follow-up    3 month for cough, tested positive 07/08/20 for Covid, Cough is worse in last 3 weeks dry, nagging cough, non productive,   Dyspnea:  Not limited by breathing from desired activities   Cough: mostly daytime dry now   Sleeping: ok p 2 chlortab hs  SABA use: not needing  02: none  Covid status: x 3 vax and omicron variant  Rec Depomedrol 120 mg IM  today  For cough > delsym 2 tsp every 12 hours as needed  If not happy ok  to take an extra dose or two daytime doses of gabapentin 100    12/06/2020  f/u ov/[place] office/Haley Mcgrath re: cough variant asthma vs uacs  maint on dulera 100 2 12h and singulair   Chief Complaint  Patient presents with   Follow-up    Feels cough and asthma has improved since last OV.Marland Kitchen    Dyspnea:  works 12 hours on feet at [employer] but no steps  [employer] with sisters around track x 30 min stopping  Cough: none Sleeping: bed blocks/ fine s resp cc  SABA use: occ hfa / never neb  02: none  Covid status: vax x 3      No obvious day to day or daytime variability or assoc excess/ purulent sputum or mucus plugs or hemoptysis or cp or chest tightness, subjective wheeze or overt sinus or hb symptoms.   Sleeping  without nocturnal  or early am exacerbation  of respiratory  c/o's or need for noct saba. Also denies any obvious fluctuation of symptoms with weather or environmental changes or other aggravating or alleviating factors except as outlined above   No unusual exposure hx or h/o childhood pna  or knowledge of premature birth.  Current Allergies, Complete Past Medical History, Past Surgical History, Family History, and Social History were reviewed in [facility] record.  ROS  The following are not active complaints unless bolded Hoarseness, sore throat, dysphagia, dental problems, itching, sneezing,  nasal congestion or discharge of excess mucus or purulent secretions, ear ache,   fever, chills, sweats, unintended wt loss or wt gain, classically pleuritic or exertional cp,  orthopnea pnd or arm/hand swelling  or leg swelling, presyncope, palpitations, abdominal pain, anorexia, nausea, vomiting, diarrhea  or change in bowel habits or change in bladder habits, change in stools or change in urine, dysuria, hematuria,  rash, arthralgias, visual complaints, headache, numbness, weakness or ataxia or problems with walking or coordination,  change in mood or   memory.        Current Meds  Medication Sig   albuterol (PROVENTIL HFA;VENTOLIN HFA) 108 (90 Base) MCG/ACT inhaler Inhale 2 puffs into the lungs every 4 (four) hours as needed for wheezing or shortness of breath.   albuterol (PROVENTIL) (2.5 MG/3ML) 0.083% nebulizer solution Take 3 mLs (2.5 mg total) by nebulization every 6 (six) hours as needed for wheezing or shortness of breath.   allopurinol (ZYLOPRIM) 100 MG tablet  TAKE 1 TABLET(100 MG) BY MOUTH DAILY   chlorpheniramine (CHLOR-TRIMETON) 4 MG tablet Take 4 mg by mouth every 4 (four) hours as needed for allergies.   cyclobenzaprine (FLEXERIL) 10 MG tablet Take 10 mg by mouth 3 (three) times daily as needed for muscle spasms.   famotidine (PEPCID) 20 MG tablet One after supper   fluticasone (FLONASE) 50 MCG/ACT nasal spray SHAKE LIQUID AND USE 2 SPRAYS IN EACH NOSTRIL DAILY   furosemide (LASIX) 20 MG tablet TAKE 1 TABLET(20 MG) BY MOUTH DAILY   gabapentin (NEURONTIN) 100 MG capsule Take 1 capsule (100 mg total) by mouth 4 (four) times daily.   gabapentin (NEURONTIN) 100 MG capsule Take 2 capsules (200 mg total) by mouth 4 (four) times daily.   guaiFENesin-codeine 100-10 MG/5ML syrup Take 10 mLs by mouth every 4 (four) hours as needed.   ibuprofen (ADVIL) 800 MG tablet TAKE 1 TABLET BY MOUTH EVERY 8 HOURS AS NEEDED FOR PAIN   metFORMIN (GLUCOPHAGE) 500 MG tablet TAKE 1 TABLET(500 MG) BY MOUTH DAILY WITH BREAKFAST   mometasone-formoterol (DULERA) 100-5 MCG/ACT AERO Take 2 puffs first thing in am and then another 2 puffs about 12 hours later.   montelukast (SINGULAIR) 10 MG tablet Take 10 mg by mouth at bedtime.   simvastatin (ZOCOR) 20 MG tablet TAKE 1 TABLET(20 MG) BY MOUTH DAILY                        Past Medical History:  Diagnosis Date   Arthritis    bilateral in hands   Asthma    history of childhood asthma   CTS (carpal tunnel syndrome)    Elevated liver enzymes    Fatty liver    High cholesterol    Hypertension           Objective:      12/06/2020       275  04/09/2020          267   03/26/2020       275  02/12/20 276 lb 3.2 oz (125.3 kg)  12/18/19 272 lb (123.4 kg)  10/05/19 266 lb (120.7 kg)     Vital signs reviewed  12/06/2020  - Note at rest 02 sats  100% on RA   General appearance:    amb obese bf nad   HEENT : pt wearing mask not removed for exam due to covid -19 concerns.    NECK :  without JVD/Nodes/TM/ nl carotid upstrokes bilaterally   LUNGS: no acc muscle use,  Nl contour chest which is clear to A and P bilaterally without cough on insp or exp maneuvers   CV:  RRR  no s3 or murmur or increase in P2, and no edema   ABD:  obese soft and nontender with nl inspiratory excursion in the supine position. No bruits or organomegaly appreciated, bowel sounds nl  MS:  Nl gait/ ext warm without deformities, calf tenderness, cyanosis or clubbing No obvious joint restrictions   SKIN: warm and dry without lesions    NEURO:  alert, approp, nl sensorium with  no motor or cerebellar deficits apparent.                    Assessment

## 2020-12-06 NOTE — Assessment & Plan Note (Addendum)
Body mass index is 43.08 kg/m.  -  Trending up again despite regular track walking  Lab Results  Component Value Date   TSH 1.920 10/05/2019      Contributing to doe and risk of GERD >>>   reviewed the need and the process to achieve and maintain neg calorie balance > defer f/u primary care including intermittently monitoring thyroid status             Each maintenance medication was reviewed in detail including emphasizing most importantly the difference between maintenance and prns and under what circumstances the prns are to be triggered using an action plan format where appropriate.  Total time for H and P, chart review, counseling, reviewing hfa device(s) and generating customized AVS unique to this office visit / same day charting = 21 min

## 2020-12-06 NOTE — Assessment & Plan Note (Addendum)
Onset in her 20's with background of ? Asthma as child but no seasonal pattern -  Allergy profile 02/12/2020 >  Eos 0.3 /  IgE 43   - 03/26/2020    try dulera 100 2bid  - 04/09/2020  After extensive coaching inhaler device,  effectiveness =    75% try dulera 100 one bid plus build up gabapentin to 100 qid > much better until covid early June 2022   - The proper method of use, as well as anticipated side effects, of a metered-dose inhaler were discussed and demonstrated to the patient using teach back method.    All goals of chronic asthma control met including optimal function and elimination of symptoms with minimal need for rescue therapy.  Contingencies discussed in full including contacting this office immediately if not controlling the symptoms using the rule of two's.     F/u yearly at this point

## 2020-12-06 NOTE — Patient Instructions (Signed)
No change in your medications    Call if not doing great on dulera 100 Take 2 puffs first thing in am and then another 2 puffs about 12 hours later.     Please schedule a follow up visit in 12 months but call sooner if needed

## 2020-12-13 ENCOUNTER — Ambulatory Visit: Payer: BC Managed Care – PPO | Admitting: Nurse Practitioner

## 2020-12-25 DIAGNOSIS — Z0001 Encounter for general adult medical examination with abnormal findings: Secondary | ICD-10-CM | POA: Diagnosis not present

## 2021-01-03 ENCOUNTER — Other Ambulatory Visit: Payer: Self-pay

## 2021-01-03 ENCOUNTER — Ambulatory Visit (HOSPITAL_COMMUNITY)
Admission: RE | Admit: 2021-01-03 | Discharge: 2021-01-03 | Disposition: A | Discharge status: Home / Self Care | Payer: BC Managed Care – PPO | Source: Ambulatory Visit | Attending: Family Medicine | Admitting: Family Medicine

## 2021-01-03 DIAGNOSIS — Z1231 Encounter for screening mammogram for malignant neoplasm of breast: Secondary | ICD-10-CM | POA: Diagnosis not present

## 2021-02-25 ENCOUNTER — Other Ambulatory Visit: Payer: Self-pay | Admitting: Internal Medicine

## 2021-03-11 DIAGNOSIS — R059 Cough, unspecified: Secondary | ICD-10-CM | POA: Diagnosis not present

## 2021-05-21 ENCOUNTER — Other Ambulatory Visit: Payer: Self-pay | Admitting: Internal Medicine

## 2021-08-20 ENCOUNTER — Other Ambulatory Visit: Payer: Self-pay

## 2021-08-20 MED ORDER — FAMOTIDINE 20 MG PO TABS: ORAL_TABLET | ORAL | 3 refills | Status: DC

## 2021-08-20 NOTE — Telephone Encounter (Signed)
Received faxed refill request from [facility] to refill [phone] Famotidine 20 mg tablets.

## 2021-11-17 ENCOUNTER — Other Ambulatory Visit: Payer: Self-pay | Admitting: Internal Medicine

## 2021-12-10 ENCOUNTER — Ambulatory Visit: Payer: Self-pay | Admitting: Internal Medicine

## 2021-12-24 ENCOUNTER — Encounter: Payer: Self-pay | Admitting: Internal Medicine

## 2021-12-24 ENCOUNTER — Ambulatory Visit: Payer: BC Managed Care – PPO | Admitting: Internal Medicine

## 2021-12-24 VITALS — BP 132/72 | HR 68 | Temp 98.4°F | Ht 67.0 in | Wt 271.0 lb

## 2021-12-24 DIAGNOSIS — J45991 Cough variant asthma: Secondary | ICD-10-CM

## 2021-12-24 MED ORDER — MOMETASONE FURO-FORMOTEROL FUM 100-5 MCG/ACT IN AERO: INHALATION_SPRAY | RESPIRATORY_TRACT | 11 refills | Status: DC

## 2021-12-24 MED ORDER — AIRSUPRA 90-80 MCG/ACT IN AERO: 1.0000 | INHALATION_SPRAY | RESPIRATORY_TRACT | 1 refills | Status: DC | PRN

## 2021-12-24 NOTE — Assessment & Plan Note (Signed)
Onset in her 20's with background of ? Asthma as child but no seasonal pattern -  Allergy profile 02/12/2020 >  Eos 0.3 /  IgE 43   - 03/26/2020    try dulera 100 2bid  - 04/09/2020  After extensive coaching inhaler device,  effectiveness =    75% try dulera 100 one bid plus build up gabapentin to 100 qid > says off as of 12/24/2021   Had one episode 3 m prior to OV  req 6 d pred and now All goals of chronic asthma control met including optimal function and elimination of symptoms with minimal need for rescue therapy.  Contingencies discussed in full including contacting this office immediately if not controlling the symptoms using the rule of two's.     - 12/24/2021  After extensive coaching inhaler device,  effectiveness =  75% from a baseline of 25%  short Ti > try airsupra as rescue to eliminate need for prednisone

## 2021-12-24 NOTE — Patient Instructions (Addendum)
Dulera 100 Take 2 puffs first thing in am and then another 2 puffs about 12 hours later.     In event of attack continue dulera as above and add Air supra up to 2 puffs every 4 hours as needed (instead of albuterol like you have done in the past)  Work on inhaler technique:  relax and gently blow all the way out then take a nice smooth full deep breath back in, triggering the inhaler at same time you start breathing in.  Hold breath in for at least  5 seconds if you can. Blow out [place] out thru nose. Rinse and gargle with water when done.  If mouth or throat bother you at all,  try brushing teeth/gums/tongue with arm and hammer toothpaste/ make a slurry and gargle and spit out.      Please schedule a follow up visit in 12 months but call sooner if needed

## 2021-12-24 NOTE — Progress Notes (Unsigned)
Haley Mcgrath, female    DOB: 1960-10-13, 61 y.o.   MRN: [id number]   Brief patient profile:  66 yobf never smoker always lived in [place] was told asthma as child and did fine in school then saw allergist in her 20's for recurrent cough does not remember findings/ recs but ever since then up to 2 x a year develops severe paroxysmal cough lasting up to a month  Typically "starts like a head cold" and goes into chest so referred to pulmonary clinic in [place]  02/12/2020 by Dr   Jeanice Lim    History of Present Illness  02/12/2020  Pulmonary/ 1st Mcgrath eval/ Haley Mcgrath / Sales executive Complaint  Patient presents with   Consult    Had non productive cough for about a month which has no resolved  Dyspnea:  Not limited by breathing from desired activities  / works as Chief Strategy Officer nights  Cough: no  Sleep: able to sleep flat bed couple of pillows  SABA use: none now rec Pantoprazole (protonix) 40 mg   Take  30-60 min before first meal of the day and Pepcid (famotidine)  20 mg one after supper  until return to [facility] - this is the best way to tell whether stomach acid is contributing to your problem.   GERD diet  For breathing >>>Only use your albuterol as a rescue medication to be used if you can't catch your breath  For coughing >  tessilon 200 mg up to three times a day For drainage / throat tickle/sneezing  try take CHLORPHENIRAMINE  4 mg     Please schedule a follow up Mcgrath visit in 6 weeks, call sooner if needed with all medications /inhalers/ solutions   03/26/2020  f/u ov/Haley Mcgrath/Haley Mcgrath re: cough variant asthma vs uacs  Chief Complaint  Patient presents with   Follow-up    Cough has been worse since the last visit- non prod and triggered by strong scents. She is using her albuterol inhaler 1-2 x per day.   Dyspnea: better with saba  Cough: worse with activity and strong smells / non productive  Sleeping: bed is flat  SABA use: sev times a day thinks it may help cough  too 02: none  Covid status: Vax x 3  rec Dulera 100  Take 2 puffs first thing in am and then another 2 puffs about 12 hours later.  Work on inhaler technique:   Only use your albuterol as a rescue medication Eliminate the cough x 3 straight days/night with the codeine syrup  then as needed  Gabapentin 100 mg  Four times a day until return  Chlorpheniramine 4 mg take 2 at bedtime  Depomerol 120 mg IM      04/09/2020  f/u ov/Haley Mcgrath/Haley Mcgrath re: cough variant asthma with uacs  Chief Complaint  Patient presents with   Follow-up    Cough has improved some since the last visit. She does still clear her throat occ. She has not used her albuterol inhaler or neb.    Dyspnea: Not limited by breathing from desired activities   Cough: worse after get up but doesn't take gabapentin  Sleeping: flat bed with pillows  SABA use: none  02: none   Covid status:  vax x 3  Lung cancer screening: n/a   Rec Work on inhaler technique ok to try dulera 100 one twice daily - first dose is first thing in am with a 100 mg of gabapentin  If not better  after a few weeks  the next step is add another gabapentin 100 mg at bedtime  >>>  cough completely stopped  p around April 2022  while on gabapentin 100 qid    Caught covid with first symptoms at end of May 2022 > flare  of cough while still on gabapentin and dulera 100 one bid and gerd rx     07/25/2020  f/u ov/Haley Mcgrath/Haley Mcgrath re: cough variant asthma vs uacs  Chief Complaint  Patient presents with   Follow-up    3 month for cough, tested positive 07/08/20 for Covid, Cough is worse in last 3 weeks dry, nagging cough, non productive,   Dyspnea:  Not limited by breathing from desired activities   Cough: mostly daytime dry now   Sleeping: ok p 2 chlortab hs  SABA use: not needing  02: none  Covid status: x 3 vax and omicron variant  Rec Depomedrol 120 mg IM  today  For cough > delsym 2 tsp every 12 hours as needed  If not happy ok to take  an extra dose or two daytime doses of gabapentin 100    12/06/2020  f/u ov/Haley Mcgrath/Haley Mcgrath re: cough variant asthma vs uacs  maint on dulera 100 2 12h and singulair   Chief Complaint  Patient presents with   Follow-up    Feels cough and asthma has improved since last OV.Marland Kitchen    Dyspnea:  works 12 hours on feet at [employer] but no steps  [employer] with sisters around track x 30 min stopping  Cough: none Sleeping: bed blocks/ fine s resp cc  SABA use: occ hfa / never neb  02: none  Covid status: vax x 3  Rec No change in your medications   Call if not doing great on dulera 100 Take 2 puffs first thing in am and then another 2 puffs about 12 hours later.     12/24/2021  f/u ov/Haley Mcgrath/Haley Mcgrath re: asthma since child maint on dulera 100 2bid and singulari  - pred 3-4 m ago  and no gabapentin now  Chief Complaint  Patient presents with   Follow-up    Asthma is doing better since last ov    Dyspnea:  still working 12 h all night, up ladders  Cough: none  Sleeping: no resp cc  SABA use: rarely  02: none      No obvious day to day or daytime variability or assoc excess/ purulent sputum or mucus plugs or hemoptysis or cp or chest tightness, subjective wheeze or overt sinus or hb symptoms.   *** without nocturnal  or early am exacerbation  of respiratory  c/o's or need for noct saba. Also denies any obvious fluctuation of symptoms with weather or environmental changes or other aggravating or alleviating factors except as outlined above   No unusual exposure hx or h/o childhood pna  or knowledge of premature birth.  Current Allergies, Complete Past Medical History, Past Surgical History, Family History, and Social History were reviewed in [employer] record.  ROS  The following are not active complaints unless bolded Hoarseness, sore throat, dysphagia, dental problems, itching, sneezing,  nasal congestion or discharge of excess mucus or  purulent secretions, ear ache,   fever, chills, sweats, unintended wt loss or wt gain, classically pleuritic or exertional cp,  orthopnea pnd or arm/hand swelling  or leg swelling, presyncope, palpitations, abdominal pain, anorexia, nausea, vomiting, diarrhea  or change in bowel habits or change in bladder habits,  change in stools or change in urine, dysuria, hematuria,  rash, arthralgias, visual complaints, headache, numbness, weakness or ataxia or problems with walking or coordination,  change in mood or  memory.        Current Meds  Medication Sig   albuterol (PROVENTIL HFA;VENTOLIN HFA) 108 (90 Base) MCG/ACT inhaler Inhale 2 puffs into the lungs every 4 (four) hours as needed for wheezing or shortness of breath.   albuterol (PROVENTIL) (2.5 MG/3ML) 0.083% nebulizer solution Take 3 mLs (2.5 mg total) by nebulization every 6 (six) hours as needed for wheezing or shortness of breath.   allopurinol (ZYLOPRIM) 100 MG tablet TAKE 1 TABLET(100 MG) BY MOUTH DAILY   chlorpheniramine (CHLOR-TRIMETON) 4 MG tablet Take 4 mg by mouth every 4 (four) hours as needed for allergies.   cyclobenzaprine (FLEXERIL) 10 MG tablet Take 10 mg by mouth 3 (three) times daily as needed for muscle spasms.   famotidine (PEPCID) 20 MG tablet TAKE 1 TABLET BY MOUTH AFTER SUPPER   fluticasone (FLONASE) 50 MCG/ACT nasal spray SHAKE LIQUID AND USE 2 SPRAYS IN EACH NOSTRIL DAILY   furosemide (LASIX) 20 MG tablet TAKE 1 TABLET(20 MG) BY MOUTH DAILY   gabapentin (NEURONTIN) 100 MG capsule Take 1 capsule (100 mg total) by mouth 4 (four) times daily.   gabapentin (NEURONTIN) 100 MG capsule Take 2 capsules (200 mg total) by mouth 4 (four) times daily.   guaiFENesin-codeine 100-10 MG/5ML syrup Take 10 mLs by mouth every 4 (four) hours as needed.   ibuprofen (ADVIL) 800 MG tablet TAKE 1 TABLET BY MOUTH EVERY 8 HOURS AS NEEDED FOR PAIN   metFORMIN (GLUCOPHAGE) 500 MG tablet TAKE 1 TABLET(500 MG) BY MOUTH DAILY WITH BREAKFAST    mometasone-formoterol (DULERA) 100-5 MCG/ACT AERO Take 2 puffs first thing in am and then another 2 puffs about 12 hours later.   montelukast (SINGULAIR) 10 MG tablet Take 10 mg by mouth at bedtime.   pantoprazole (PROTONIX) 40 MG tablet TAKE 1 TABLET(40 MG) BY MOUTH DAILY 30 TO 60 MINUTES BEFORE FIRST MEAL OF THE DAY   simvastatin (ZOCOR) 20 MG tablet TAKE 1 TABLET(20 MG) BY MOUTH DAILY                             Past Medical History:  Diagnosis Date   Arthritis    bilateral in hands   Asthma    history of childhood asthma   CTS (carpal tunnel syndrome)    Elevated liver enzymes    Fatty liver    High cholesterol    Hypertension          Objective:    Wts  12/24/2021     ***  12/06/2020       275  04/09/2020          267   03/26/2020       275  02/12/20 276 lb 3.2 oz (125.3 kg)  12/18/19 272 lb (123.4 kg)  10/05/19 266 lb (120.7 kg)    Vital signs reviewed  12/24/2021  - Note at rest 02 sats  ***% on ***   General appearance:    ***               Assessment

## 2021-12-25 ENCOUNTER — Encounter: Payer: Self-pay | Admitting: Internal Medicine

## 2021-12-25 NOTE — Assessment & Plan Note (Signed)
Body mass index is 42.44 kg/m.  -  trending down slightly/ encouraged  Lab Results  Component Value Date   TSH 1.920 10/05/2019      Contributing to doe and risk of GERD >>>   reviewed the need and the process to achieve and maintain neg calorie balance > defer f/u primary care including intermittently monitoring thyroid status           Each maintenance medication was reviewed in detail including emphasizing most importantly the difference between maintenance and prns and under what circumstances the prns are to be triggered using an action plan format where appropriate.  Total time for H and P, chart review, counseling, reviewing hfa  device(s) and generating customized AVS unique to this office visit / same day charting  > 30 min for annual asthma eval

## 2021-12-29 ENCOUNTER — Other Ambulatory Visit (HOSPITAL_COMMUNITY): Payer: Self-pay | Admitting: Family Medicine

## 2021-12-29 DIAGNOSIS — Z1231 Encounter for screening mammogram for malignant neoplasm of breast: Secondary | ICD-10-CM

## 2021-12-31 ENCOUNTER — Telehealth: Payer: Self-pay

## 2021-12-31 ENCOUNTER — Other Ambulatory Visit (HOSPITAL_COMMUNITY): Payer: Self-pay

## 2021-12-31 NOTE — Telephone Encounter (Signed)
PA request received via CMM for Dulera 100-5MCG/ACT aerosol through [employer].  PA has been submitted and is awaiting determination.  Key: [id number]

## 2022-01-01 ENCOUNTER — Other Ambulatory Visit (HOSPITAL_COMMUNITY): Payer: Self-pay

## 2022-01-05 ENCOUNTER — Other Ambulatory Visit (HOSPITAL_COMMUNITY): Payer: Self-pay

## 2022-01-05 NOTE — Telephone Encounter (Signed)
Additional questions answered through [facility] and submitted to insurance through fax for [facility].

## 2022-01-08 ENCOUNTER — Ambulatory Visit (HOSPITAL_COMMUNITY)
Admission: RE | Admit: 2022-01-08 | Discharge: 2022-01-08 | Disposition: A | Discharge status: Home / Self Care | Payer: BC Managed Care – PPO | Source: Ambulatory Visit | Attending: Family Medicine | Admitting: Family Medicine

## 2022-01-08 DIAGNOSIS — Z1231 Encounter for screening mammogram for malignant neoplasm of breast: Secondary | ICD-10-CM | POA: Insufficient documentation

## 2022-01-12 ENCOUNTER — Other Ambulatory Visit (HOSPITAL_COMMUNITY): Payer: Self-pay

## 2022-01-12 NOTE — Telephone Encounter (Signed)
BREO 100 one click each am but will need ov before 1st month is out to be sure this is adequate

## 2022-01-12 NOTE — Telephone Encounter (Signed)
PA DENIED, covered alternative preferred by patients insurance at this time is Breo.

## 2022-01-13 NOTE — Telephone Encounter (Signed)
Attempted to call patient to give her an update on medication but no voicemail picked up. Will try again

## 2022-01-16 MED ORDER — FLUTICASONE FUROATE-VILANTEROL 100-25 MCG/ACT IN AEPB: 1.0000 | INHALATION_SPRAY | Freq: Every day | RESPIRATORY_TRACT | 11 refills | Status: DC

## 2022-01-16 NOTE — Telephone Encounter (Signed)
Spoke with the pt  She states okay with Breo being sent  Rx was sent to pharm  Next avail for [place] was scheduled

## 2022-03-01 NOTE — Progress Notes (Deleted)
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   GERD diet  For breathing >>>Only use your albuterol as a rescue medication to be used if you can't catch your breath  For coughing >  tessilon 200 mg up to three times a day For drainage / throat tickle/sneezing  try take CHLORPHENIRAMINE  4 mg     Please schedule a follow up office visit in 6 weeks, call sooner if needed with all medications /inhalers/ solutions    12/06/2020  f/u ov/[place] office/Haley Mcgrath re: cough variant asthma vs uacs  maint on dulera 100 2 12h and singulair   Chief Complaint  Patient presents with   Follow-up    Feels cough and asthma has improved since last OV.Marland Kitchen    Dyspnea:  works 12 hours on feet at [employer] but no steps  [employer] with sisters around track x 30 min stopping  Cough: none Sleeping: bed blocks/ fine s resp cc  SABA use: occ hfa / never neb  02:  none  Covid status: vax x 3  Rec No change in your medications   Call if not doing great on dulera 100 Take 2 puffs first thing in am and then another 2 puffs about 12 hours later.     12/24/2021  f/u ov/[place] office/Haley Mcgrath re: asthma since child maint on dulera 100 2bid and singulari  - pred 3-4 m prior to OV   and no longer on  gabapentin   Chief Complaint  Patient presents with   Follow-up    Asthma is doing better since last ov   Dyspnea:  still working 12 h all night, up ladders s sob  Cough: none  Sleeping: no resp cc  SABA use: rarely  02: none  Rec Dulera 100 Take 2 puffs first thing in am and then another 2 puffs about 12 hours later.  In event of attack continue dulera as above and add Air supra up to 2 puffs every 4 hours as needed (instead of albuterol like you have done in the past) Work on inhaler technique:   Please schedule a follow up visit in 12 months but call sooner if needed    03/02/2022  f/u ov/[place] office/Haley Mcgrath re: *** maint on ***  No chief complaint on file.   Dyspnea:  *** Cough: *** Sleeping: *** SABA use: *** 02: *** Covid status: *** Lung cancer screening: ***   No obvious day to day or daytime variability or assoc excess/ purulent sputum or mucus plugs or hemoptysis or cp or chest tightness, subjective wheeze or overt sinus or hb symptoms.   *** without nocturnal  or early am exacerbation  of respiratory  c/o's or need for noct saba. Also denies any obvious fluctuation of symptoms with weather or environmental changes or other aggravating or alleviating factors except as outlined above   No unusual exposure hx or h/o childhood pna/ asthma or knowledge of premature birth.  Current Allergies, Complete Past Medical History, Past Surgical History, Family History, and Social History were reviewed in [employer] record.  ROS  The following are not active complaints unless bolded Hoarseness, sore throat, dysphagia,  dental problems, itching, sneezing,  nasal congestion or discharge of excess mucus or purulent secretions, ear ache,   fever, chills, sweats, unintended wt loss or wt gain, classically pleuritic or exertional cp,  orthopnea pnd or arm/hand swelling  or leg swelling, presyncope, palpitations, abdominal pain, anorexia, nausea, vomiting, diarrhea  or change in bowel habits or change in bladder habits, change in stools or change in urine, dysuria, hematuria,  rash, arthralgias, visual complaints, headache, numbness, weakness or ataxia or problems with walking or coordination,  change in mood or  memory.        No outpatient medications have been marked as taking for the 03/02/22 encounter (Appointment) with Tanda Rockers, MD.             Past Medical History:  Diagnosis Date   Arthritis    bilateral in hands   Asthma    history of childhood asthma   CTS (carpal tunnel syndrome)    Elevated liver enzymes    Fatty liver    High cholesterol    Hypertension          Objective:    Wts  03/02/2022       ***  12/24/2021     271  12/06/2020       275  04/09/2020          267   03/26/2020       275  02/12/20 276 lb 3.2 oz (125.3 kg)  12/18/19 272 lb (123.4 kg)  10/05/19 266 lb (120.7 kg)    Vital signs reviewed  03/02/2022  - Note at rest 02 sats  ***% on ***   General appearance:    ***           Assessment

## 2022-03-02 ENCOUNTER — Ambulatory Visit: Payer: BC Managed Care – PPO | Admitting: Internal Medicine

## 2022-03-19 ENCOUNTER — Ambulatory Visit: Payer: BC Managed Care – PPO | Admitting: Internal Medicine

## 2022-03-19 ENCOUNTER — Encounter: Payer: Self-pay | Admitting: Internal Medicine

## 2022-03-19 VITALS — BP 138/82 | HR 88 | Ht 67.0 in | Wt 263.0 lb

## 2022-03-19 DIAGNOSIS — J45991 Cough variant asthma: Secondary | ICD-10-CM

## 2022-03-19 NOTE — Progress Notes (Signed)
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   GERD diet  For breathing >>>Only use your albuterol as a rescue medication to be used if you can't catch your breath  For coughing >  tessilon 200 mg up to three times a day For drainage / throat tickle/sneezing  try take CHLORPHENIRAMINE  4 mg         12/24/2021  f/u ov/Haley Mcgrath office/Haley Mcgrath re: asthma since child maint on dulera 100 2bid and singulair  - pred 3-4 m prior to OV   and no longer on  gabapentin   Chief Complaint  Patient presents with   Follow-up    Asthma is doing better since last ov   Dyspnea:  still working 12 h all night, up ladders s sob  Cough: none  Sleeping: no resp cc  SABA use: rarely  02: none  Rec Dulera 100 Take 2 puffs first thing in am and then another 2 puffs about 12 hours later.  In event of attack continue dulera as above and add Air supra up to 2 puffs  every 4 hours as needed (instead of albuterol like you have done in the past) Work on inhaler technique:      03/19/2022  f/u ov/[place] office/Haley Mcgrath re: asthma since childhood maint on BREO   Chief Complaint  Patient presents with   Follow-up    Cough is much better since last ov. No new concerns   Dyspnea:  on feet 12 hours and up ladders  Cough: tolerable on BREO (not using arm and hammer)  Sleeping: couple times a weeks  SABA use: hfa couple  02: none  Covid status: all but last      No obvious day to day or daytime variability or assoc excess/ purulent sputum or mucus plugs or hemoptysis or cp or chest tightness, subjective wheeze or overt sinus or hb symptoms.     Also denies any obvious fluctuation of symptoms with weather or environmental changes or other aggravating or alleviating factors except as outlined above   No unusual exposure hx or h/o childhood pna  or knowledge of premature birth.  Current Allergies, Complete Past Medical History, Past Surgical History, Family History, and Social History were reviewed in [employer]  medical record.  ROS  The following are not active complaints unless bolded Hoarseness, sore throat, dysphagia, dental problems, itching, sneezing,  nasal congestion or discharge of excess mucus or purulent secretions, ear ache,   fever, chills, sweats, unintended wt loss or wt gain, classically pleuritic or exertional cp,  orthopnea pnd or arm/hand swelling  or leg swelling, presyncope, palpitations, abdominal pain, anorexia, nausea, vomiting, diarrhea  or change in bowel habits or change in bladder habits, change in stools or change in urine, dysuria, hematuria,  rash, arthralgias, visual complaints, headache, numbness, weakness or ataxia or problems with walking or coordination,  change in mood or  memory.        Current Meds  Medication Sig   albuterol (PROVENTIL HFA;VENTOLIN HFA) 108 (90 Base) MCG/ACT inhaler Inhale 2 puffs into  the lungs every 4 (four) hours as needed for wheezing or shortness of breath.   albuterol (PROVENTIL) (2.5 MG/3ML) 0.083% nebulizer solution Take 3 mLs (2.5 mg total) by nebulization every 6 (six) hours as needed for wheezing or shortness of breath.   Albuterol-Budesonide (AIRSUPRA) 90-80 MCG/ACT AERO Inhale 1-2 puffs into the lungs every 4 (four) hours as needed.   allopurinol (ZYLOPRIM) 100 MG tablet TAKE 1 TABLET(100 MG) BY MOUTH DAILY   chlorpheniramine (CHLOR-TRIMETON) 4 MG tablet Take 4 mg by mouth every 4 (four) hours as needed for allergies.   cyclobenzaprine (FLEXERIL) 10 MG tablet Take 10 mg by mouth 3 (three) times daily as needed for muscle spasms.   famotidine (PEPCID) 20 MG tablet TAKE 1 TABLET BY MOUTH AFTER SUPPER   fluticasone (FLONASE) 50 MCG/ACT nasal spray SHAKE LIQUID AND USE 2 SPRAYS IN EACH NOSTRIL DAILY   fluticasone furoate-vilanterol (BREO ELLIPTA) 100-25 MCG/ACT AEPB Inhale 1 puff into the lungs daily.   furosemide (LASIX) 20 MG tablet TAKE 1 TABLET(20 MG) BY MOUTH DAILY   guaiFENesin-codeine 100-10 MG/5ML syrup Take 10 mLs by mouth every 4 (four) hours as needed.   ibuprofen (ADVIL) 800 MG tablet TAKE 1 TABLET BY MOUTH EVERY 8 HOURS AS NEEDED FOR PAIN   metFORMIN (GLUCOPHAGE) 500 MG tablet TAKE 1 TABLET(500 MG) BY MOUTH DAILY WITH BREAKFAST   montelukast (SINGULAIR) 10 MG tablet Take 10 mg by mouth at bedtime.   pantoprazole (PROTONIX) 40 MG tablet TAKE 1 TABLET(40 MG) BY MOUTH DAILY 30 TO 60 MINUTES BEFORE FIRST MEAL OF THE DAY   simvastatin (ZOCOR) 20 MG tablet TAKE 1 TABLET(20 MG) BY MOUTH DAILY             Past Medical History:  Diagnosis Date   Arthritis    bilateral in hands   Asthma    history of childhood asthma   CTS (carpal tunnel syndrome)    Elevated liver enzymes    Fatty liver    High cholesterol    Hypertension          Objective:    Wts  03/19/2022      263   12/24/2021     271  12/06/2020       275  04/09/2020         267    03/26/2020       275  02/12/20 276 lb 3.2 oz (125.3 kg)  12/18/19 272 lb (123.4 kg)  10/05/19 266 lb (120.7 kg)    Vital signs reviewed  03/19/2022  - Note at rest 02 sats  99% on RA   General appearance:    amb MO (by BMI) pleasant bf / occ  spont dry cough    HEENT : Oropharynx  clear/ occ throat clearing during ov          NECK :  without  apparent JVD/ palpable Nodes/TM    LUNGS: no acc muscle use,  Nl contour chest which is clear to A and P bilaterally without cough on insp or exp maneuvers   CV:  RRR  no s3 or murmur or increase in P2, and no edema   ABD:  obese/ soft and nontender    MS:  Nl gait/ ext warm without deformities Or obvious joint restrictions  calf tenderness, cyanosis or clubbing    SKIN: warm and dry without lesions    NEURO:  alert, approp, nl sensorium with  no motor or cerebellar deficits apparent.            Assessment

## 2022-03-19 NOTE — Patient Instructions (Addendum)
Work on inhaler technique:  relax and gently blow all the way out then take a nice smooth full deep breath back in.  Hold breath in for at least  5 seconds if you can. Blow out breo out thru nose. Rinse and gargle with water when done.  If mouth or throat bother you at all,  try brushing teeth/gums/tongue with arm and hammer toothpaste/ make a slurry and gargle and spit out.   Call if worse cough, breathing or need for more albuterol but we'll need to figure out what the alternatives are for your BREO on your insurance plan (drug formulary needed )    Please schedule a follow up visit in 6 months but call sooner if needed

## 2022-03-19 NOTE — Assessment & Plan Note (Addendum)
Onset in her 20's with background of ? Asthma as child but no seasonal pattern -  Allergy profile 02/12/2020 >  Eos 0.3 /  IgE 43   - 03/26/2020    try dulera 100 2bid  - 04/09/2020  After extensive coaching inhaler device,  effectiveness =    75% try dulera 100 one bid plus build up gabapentin to 100 qid > much better until covid early June 2022  - 12/24/2021  After extensive coaching inhaler device,  effectiveness =  75% from a baseline of 25%) short Ti > try airsupra as rescue -  12/31/21 changed to [place] 100 by insurance > 03/19/2022 reported improvement on Breo 100  All goals of chronic asthma control met including optimal function and elimination of symptoms with minimal need for rescue therapy.  Contingencies discussed in full including contacting this office immediately if not controlling the symptoms using the rule of two's.     Still concerned about the cough but I don't think it's being caused by the asthma. On the other hand, if this is UACS as suspect she may not be able to tolerate DPI's longterm. Advised to use arm and hammer tootpaste p am BREO 100 and call for appt with formulary if not tolerating the dpi or hfa use goes up.          Each maintenance medication was reviewed in detail including emphasizing most importantly the difference between maintenance and prns and under what circumstances the prns are to be triggered using an action plan format where appropriate.  Total time for H and P, chart review, counseling, reviewing hfa/neb/dpi/elipta device(s) and generating customized AVS unique to this office visit / same day charting = 30 min

## 2022-04-15 ENCOUNTER — Ambulatory Visit: Payer: BC Managed Care – PPO | Admitting: Internal Medicine

## 2022-04-21 ENCOUNTER — Encounter (INDEPENDENT_AMBULATORY_CARE_PROVIDER_SITE_OTHER): Payer: Self-pay | Admitting: *Deleted

## 2022-05-06 ENCOUNTER — Telehealth (INDEPENDENT_AMBULATORY_CARE_PROVIDER_SITE_OTHER): Payer: Self-pay | Admitting: Gastroenterology

## 2022-05-06 NOTE — Telephone Encounter (Signed)
Room 3  Thanks 

## 2022-05-06 NOTE — Telephone Encounter (Signed)
Who is your primary care physician: Seward Meth  Reasons for the colonoscopy: 10 year recall  Have you had a colonoscopy before?  Yes 10 years ago  Do you have family history of colon cancer? no  Previous colonoscopy with polyps removed? no  Do you have a history colorectal cancer?   no  Are you diabetic? If yes, Type 1 or Type 2?    no  Do you have a prosthetic or mechanical heart valve? no  Do you have a pacemaker/defibrillator?   no  Have you had endocarditis/atrial fibrillation? no  Have you had joint replacement within the last 12 months?  no  Do you tend to be constipated or have to use laxatives? no  Do you have any history of drugs or alchohol?  no  Do you use supplemental oxygen?  no  Have you had a stroke or heart attack within the last 6 months? no  Do you take weight loss medication?  no  For female patients: have you had a hysterectomy?  no                                     are you post menopausal?       no                                            do you still have your menstrual cycle? no      Do you take any blood-thinning medications such as: (aspirin, warfarin, Plavix, Aggrenox)  no  If yes we need the name, milligram, dosage and who is prescribing doctor  Current Outpatient Medications on File Prior to Visit  Medication Sig Dispense Refill   albuterol (PROVENTIL HFA;VENTOLIN HFA) 108 (90 Base) MCG/ACT inhaler Inhale 2 puffs into the lungs every 4 (four) hours as needed for wheezing or shortness of breath. 1 Inhaler 0   albuterol (PROVENTIL) (2.5 MG/3ML) 0.083% nebulizer solution Take 3 mLs (2.5 mg total) by nebulization every 6 (six) hours as needed for wheezing or shortness of breath. 150 mL 1   Albuterol-Budesonide (AIRSUPRA) 90-80 MCG/ACT AERO Inhale 1-2 puffs into the lungs every 4 (four) hours as needed. 10.7 g 1   allopurinol (ZYLOPRIM) 100 MG tablet TAKE 1 TABLET(100 MG) BY MOUTH DAILY 90 tablet 3   chlorpheniramine (CHLOR-TRIMETON) 4 MG  tablet Take 4 mg by mouth every 4 (four) hours as needed for allergies.     cyclobenzaprine (FLEXERIL) 10 MG tablet Take 10 mg by mouth 3 (three) times daily as needed for muscle spasms.     famotidine (PEPCID) 20 MG tablet TAKE 1 TABLET BY MOUTH AFTER SUPPER 30 tablet 3   fluticasone (FLONASE) 50 MCG/ACT nasal spray SHAKE LIQUID AND USE 2 SPRAYS IN EACH NOSTRIL DAILY 16 g 6   fluticasone furoate-vilanterol (BREO ELLIPTA) 100-25 MCG/ACT AEPB Inhale 1 puff into the lungs daily. 30 each 11   furosemide (LASIX) 20 MG tablet TAKE 1 TABLET(20 MG) BY MOUTH DAILY 90 tablet 0   guaiFENesin-codeine 100-10 MG/5ML syrup Take 10 mLs by mouth every 4 (four) hours as needed. 180 mL 0   ibuprofen (ADVIL) 800 MG tablet TAKE 1 TABLET BY MOUTH EVERY 8 HOURS AS NEEDED FOR PAIN 30 tablet 1   metFORMIN (GLUCOPHAGE) 500 MG tablet TAKE 1 TABLET(500 MG)  BY MOUTH DAILY WITH BREAKFAST 90 tablet 0   montelukast (SINGULAIR) 10 MG tablet Take 10 mg by mouth at bedtime.     pantoprazole (PROTONIX) 40 MG tablet TAKE 1 TABLET(40 MG) BY MOUTH DAILY 30 TO 60 MINUTES BEFORE FIRST MEAL OF THE DAY 30 tablet 2   simvastatin (ZOCOR) 20 MG tablet TAKE 1 TABLET(20 MG) BY MOUTH DAILY 90 tablet 3   No current facility-administered medications on file prior to visit.    Allergies  Allergen Reactions   Sulfonamide Derivatives Hives     Pharmacy: [facility]  Primary Insurance Name: [place] number where you can be reached: [phone]

## 2022-05-07 NOTE — Telephone Encounter (Signed)
Left message to return call 

## 2022-05-08 MED ORDER — PEG 3350-KCL-NA BICARB-NACL 420 G PO SOLR: 4000.0000 mL | Freq: Once | ORAL | 0 refills | Status: AC

## 2022-05-08 NOTE — Telephone Encounter (Signed)
Pt returned call and colonoscopy scheduled. Instructions sent via mail. Prep sent to pharmacy. Will call pt with pre op appt.

## 2022-05-08 NOTE — Addendum Note (Signed)
Addended by: Marlowe Shores on: 05/08/2022 08:45 AM   Modules accepted: Orders

## 2022-05-11 NOTE — Telephone Encounter (Signed)
Questionnaire from recall, no referral needed  

## 2022-05-27 NOTE — Telephone Encounter (Signed)
Detailed message left on voicemail with pre op appt information.

## 2022-05-28 ENCOUNTER — Telehealth (INDEPENDENT_AMBULATORY_CARE_PROVIDER_SITE_OTHER): Payer: Self-pay | Admitting: Gastroenterology

## 2022-05-28 NOTE — Telephone Encounter (Signed)
Pt left voicemail stating she would like to talk about her upcoming appt.  Returned call to pt but had to leave a voicemail.

## 2022-06-01 ENCOUNTER — Telehealth (INDEPENDENT_AMBULATORY_CARE_PROVIDER_SITE_OTHER): Payer: Self-pay | Admitting: Gastroenterology

## 2022-06-01 NOTE — Telephone Encounter (Signed)
Pt called in and states the she works 3rd shift and unable to attend pre op on 06/08/22 at 9:30 am. Gave pt pre op number to call and reschedule.

## 2022-06-04 NOTE — Patient Instructions (Signed)
Haley Mcgrath  06/04/2022     @PREFPERIOPPHARMACY @   Your procedure is scheduled on  06/11/2022.   Report to [facility] at  0600 A.M.   Call this number if you have problems the morning of surgery:  [phone]  If you experience any cold or flu symptoms such as cough, fever, chills, shortness of breath, etc. between now and your scheduled surgery, please notify us at the above number.   Remember:  Follow the diet and prep instructions given to you by the office.    Take these medicines the morning of surgery with A SIP OF WATER                       allopurinol, flexeril, pantoprazole.     Do not wear jewelry, make-up or nail polish.  Do not wear lotions, powders, or perfumes, or deodorant.  Do not shave 48 hours prior to surgery.  Men may shave face and neck.  Do not bring valuables to the hospital.  [facility] is not responsible for any belongings or valuables.  Contacts, dentures or bridgework may not be worn into surgery.  Leave your suitcase in the car.  After surgery it may be brought to your room.  For patients admitted to the hospital, discharge time will be determined by your treatment team.  Patients discharged the day of surgery will not be allowed to drive home and must have someone with them for 24 hours.    Special instructions:   DO NOT smoke tobacco or vape for 24 hours before your procedure.  Please read over the following fact sheets that you were given. Anesthesia Post-op Instructions and Care and Recovery After Surgery      Colonoscopy, Adult, Care After The following information offers guidance on how to care for yourself after your procedure. Your health care provider may also give you more specific instructions. If you have problems or questions, contact your health care provider. What can I expect after the procedure? After the procedure, it is common to have: A small amount of blood in your stool for 24 hours after the procedure. Some  gas. Mild cramping or bloating of your abdomen. Follow these instructions at home: Eating and drinking  Drink enough fluid to keep your urine pale yellow. Follow instructions from your health care provider about eating or drinking restrictions. Resume your normal diet as told by your health care provider. Avoid heavy or fried foods that are hard to digest. Activity Rest as told by your health care provider. Avoid sitting for a long time without moving. Get up to take short walks every 1-2 hours. This is important to improve blood flow and breathing. Ask for help if you feel weak or unsteady. Return to your normal activities as told by your health care provider. Ask your health care provider what activities are safe for you. Managing cramping and bloating  Try walking around when you have cramps or feel bloated. If directed, apply heat to your abdomen as told by your health care provider. Use the heat source that your health care provider recommends, such as a moist heat pack or a heating pad. Place a towel between your skin and the heat source. Leave the heat on for 20-30 minutes. Remove the heat if your skin turns bright red. This is especially important if you are unable to feel pain, heat, or cold. You have a greater risk of getting burned.  General instructions If you were given a sedative during the procedure, it can affect you for several hours. Do not drive or operate machinery until your health care provider says that it is safe. For the first 24 hours after the procedure: Do not sign important documents. Do not drink alcohol. Do your regular daily activities at a slower pace than normal. Eat soft foods that are easy to digest. Take over-the-counter and prescription medicines only as told by your health care provider. Keep all follow-up visits. This is important. Contact a health care provider if: You have blood in your stool 2-3 days after the procedure. Get help right away  if: You have more than a small spotting of blood in your stool. You have large blood clots in your stool. You have swelling of your abdomen. You have nausea or vomiting. You have a fever. You have increasing pain in your abdomen that is not relieved with medicine. These symptoms may be an emergency. Get help right away. Call 911. Do not wait to see if the symptoms will go away. Do not drive yourself to the hospital. Summary After the procedure, it is common to have a small amount of blood in your stool. You may also have mild cramping and bloating of your abdomen. If you were given a sedative during the procedure, it can affect you for several hours. Do not drive or operate machinery until your health care provider says that it is safe. Get help right away if you have a lot of blood in your stool, nausea or vomiting, a fever, or increased pain in your abdomen. This information is not intended to replace advice given to you by your health care provider. Make sure you discuss any questions you have with your health care provider. Document Revised: 09/11/2020 Document Reviewed: 09/11/2020 Elsevier Patient Education  [place] After The following information offers guidance on how to care for yourself after your procedure. Your health care provider may also give you more specific instructions. If you have problems or questions, contact your health care provider. What can I expect after the procedure? After the procedure, it is common to have: Tiredness. Little or no memory about what happened during or after the procedure. Impaired judgment when it comes to making decisions. Nausea or vomiting. Some trouble with balance. Follow these instructions at home: For the time period you were told by your health care provider:  Rest. Do not participate in activities where you could fall or become injured. Do not drive or use machinery. Do not drink  alcohol. Do not take sleeping pills or medicines that cause drowsiness. Do not make important decisions or sign legal documents. Do not take care of children on your own. Medicines Take over-the-counter and prescription medicines only as told by your health care provider. If you were prescribed antibiotics, take them as told by your health care provider. Do not stop using the antibiotic even if you start to feel better. Eating and drinking Follow instructions from your health care provider about what you may eat and drink. Drink enough fluid to keep your urine pale yellow. If you vomit: Drink clear fluids slowly and in small amounts as you are able. Clear fluids include water, ice chips, low-calorie sports drinks, and fruit juice that has water added to it (diluted fruit juice). Eat light and bland foods in small amounts as you are able. These foods include bananas, applesauce, rice, lean meats, toast, and crackers. General instructions  Have a responsible adult stay with you for the time you are told. It is important to have someone help care for you until you are awake and alert. If you have sleep apnea, surgery and some medicines can increase your risk for breathing problems. Follow instructions from your health care provider about wearing your sleep device: When you are sleeping. This includes during daytime naps. While taking prescription pain medicines, sleeping medicines, or medicines that make you drowsy. Do not use any products that contain nicotine or tobacco. These products include cigarettes, chewing tobacco, and vaping devices, such as e-cigarettes. If you need help quitting, ask your health care provider. Contact a health care provider if: You feel nauseous or vomit every time you eat or drink. You feel light-headed. You are still sleepy or having trouble with balance after 24 hours. You get a rash. You have a fever. You have redness or swelling around the IV site. Get help  right away if: You have trouble breathing. You have new confusion after you get home. These symptoms may be an emergency. Get help right away. Call 911. Do not wait to see if the symptoms will go away. Do not drive yourself to the hospital. This information is not intended to replace advice given to you by your health care provider. Make sure you discuss any questions you have with your health care provider. Document Revised: 06/16/2021 Document Reviewed: 06/16/2021 Elsevier Patient Education  [place].

## 2022-06-05 ENCOUNTER — Encounter (HOSPITAL_COMMUNITY): Payer: Self-pay

## 2022-06-05 ENCOUNTER — Encounter (HOSPITAL_COMMUNITY)
Admission: RE | Admit: 2022-06-05 | Discharge: 2022-06-05 | Disposition: A | Discharge status: Home / Self Care | Payer: BC Managed Care – PPO | Source: Ambulatory Visit | Attending: Gastroenterology | Admitting: Gastroenterology

## 2022-06-05 VITALS — BP 159/75 | HR 79 | Temp 97.5°F | Resp 18 | Ht 67.0 in | Wt 263.0 lb

## 2022-06-05 DIAGNOSIS — I10 Essential (primary) hypertension: Secondary | ICD-10-CM | POA: Diagnosis not present

## 2022-06-05 DIAGNOSIS — R7303 Prediabetes: Secondary | ICD-10-CM | POA: Diagnosis not present

## 2022-06-05 DIAGNOSIS — Z01818 Encounter for other preprocedural examination: Secondary | ICD-10-CM | POA: Diagnosis present

## 2022-06-05 HISTORY — DX: Type 2 diabetes mellitus without complications: E11.9

## 2022-06-05 LAB — BASIC METABOLIC PANEL
Anion gap: 9 (ref 5–15)
BUN: 17 mg/dL (ref 6–20)
CO2: 27 mmol/L (ref 22–32)
Calcium: 9.3 mg/dL (ref 8.9–10.3)
Chloride: 102 mmol/L (ref 98–111)
Creatinine, Ser: 0.98 mg/dL (ref 0.44–1.00)
GFR, Estimated: >60 mL/min (ref >60)
Glucose, Bld: 96 mg/dL (ref 70–99)
Potassium: 3.6 mmol/L (ref 3.5–5.1)
Sodium: 138 mmol/L (ref 135–145)

## 2022-06-08 ENCOUNTER — Encounter (HOSPITAL_COMMUNITY): Payer: BC Managed Care – PPO

## 2022-06-11 ENCOUNTER — Ambulatory Visit (HOSPITAL_COMMUNITY): Payer: BC Managed Care – PPO | Admitting: Anesthesiology

## 2022-06-11 ENCOUNTER — Encounter (HOSPITAL_COMMUNITY)
Admission: RE | Disposition: A | Discharge status: Home Health | Payer: Self-pay | Source: Home / Self Care | Attending: Gastroenterology

## 2022-06-11 ENCOUNTER — Encounter (HOSPITAL_COMMUNITY): Payer: Self-pay | Admitting: Gastroenterology

## 2022-06-11 ENCOUNTER — Ambulatory Visit (HOSPITAL_COMMUNITY)
Admission: RE | Admit: 2022-06-11 | Discharge: 2022-06-11 | Disposition: A | Discharge status: Home Health | Payer: BC Managed Care – PPO | Attending: Gastroenterology | Admitting: Gastroenterology

## 2022-06-11 ENCOUNTER — Encounter (INDEPENDENT_AMBULATORY_CARE_PROVIDER_SITE_OTHER): Payer: Self-pay | Admitting: *Deleted

## 2022-06-11 DIAGNOSIS — K5730 Diverticulosis of large intestine without perforation or abscess without bleeding: Secondary | ICD-10-CM | POA: Diagnosis not present

## 2022-06-11 DIAGNOSIS — I10 Essential (primary) hypertension: Secondary | ICD-10-CM | POA: Diagnosis not present

## 2022-06-11 DIAGNOSIS — E119 Type 2 diabetes mellitus without complications: Secondary | ICD-10-CM | POA: Diagnosis not present

## 2022-06-11 DIAGNOSIS — Z6841 Body Mass Index (BMI) 40.0 and over, adult: Secondary | ICD-10-CM | POA: Diagnosis not present

## 2022-06-11 DIAGNOSIS — Z1211 Encounter for screening for malignant neoplasm of colon: Secondary | ICD-10-CM | POA: Diagnosis not present

## 2022-06-11 DIAGNOSIS — J45909 Unspecified asthma, uncomplicated: Secondary | ICD-10-CM | POA: Insufficient documentation

## 2022-06-11 HISTORY — PX: COLONOSCOPY WITH PROPOFOL: SHX5780

## 2022-06-11 LAB — GLUCOSE, CAPILLARY
Glucose-Capillary: 68 mg/dL — ABNORMAL LOW (ref 70–99)
Glucose-Capillary: 102 mg/dL — ABNORMAL HIGH (ref 70–99)
Glucose-Capillary: 74 mg/dL (ref 70–99)

## 2022-06-11 LAB — HM COLONOSCOPY

## 2022-06-11 SURGERY — COLONOSCOPY WITH PROPOFOL
Anesthesia: General

## 2022-06-11 MED ORDER — PROPOFOL 10 MG/ML IV BOLUS
INTRAVENOUS | Status: DC | PRN
  Administered 2022-06-11: 100 mg via INTRAVENOUS

## 2022-06-11 MED ORDER — DEXTROSE 50 % IV SOLN
12.5000 g | INTRAVENOUS | Status: AC
  Administered 2022-06-11: 12.5 g via INTRAVENOUS

## 2022-06-11 MED ORDER — STERILE WATER FOR IRRIGATION IR SOLN
Status: DC | PRN
  Administered 2022-06-11: 120 mL

## 2022-06-11 MED ORDER — DEXTROSE 50 % IV SOLN
INTRAVENOUS | Status: AC
  Filled 2022-06-11: qty 50

## 2022-06-11 MED ORDER — PROPOFOL 500 MG/50ML IV EMUL
INTRAVENOUS | Status: DC | PRN
  Administered 2022-06-11: 150 ug/kg/min via INTRAVENOUS

## 2022-06-11 MED ORDER — LACTATED RINGERS IV SOLN
INTRAVENOUS | Status: DC
  Administered 2022-06-11: 1000 mL via INTRAVENOUS

## 2022-06-11 NOTE — Discharge Instructions (Signed)
You are being discharged to home.  Resume your previous diet.  Your physician has recommended a repeat colonoscopy in 10 years for screening purposes.  

## 2022-06-11 NOTE — Transfer of Care (Signed)
Immediate Anesthesia Transfer of Care Note  Patient: Haley Mcgrath  Procedure(s) Performed: COLONOSCOPY WITH PROPOFOL  Patient Location: Short Stay  Anesthesia Type:General  Level of Consciousness: awake, alert , and oriented  Airway & Oxygen Therapy: Patient Spontanous Breathing  Post-op Assessment: Report given to RN and Post -op Vital signs reviewed and stable  Post vital signs: Reviewed and stable  Last Vitals:  Vitals Value Taken Time  BP 110/82 06/11/22 0803  Temp 36.4 C 06/11/22 0803  Pulse 75 06/11/22 0803  Resp 12 06/11/22 0803  SpO2 100 % 06/11/22 0803    Last Pain:  Vitals:   06/11/22 0802  TempSrc:   PainSc: 0-No pain      Patients Stated Pain Goal: 7 (06/11/22 9604)  Complications: No notable events documented.

## 2022-06-11 NOTE — H&P (Signed)
Haley Mcgrath is an 62 y.o. female.   Chief Complaint: screening colonoscopy HPI: 62 y/o F with PH DM, HTN, HLD, coming for screening colonoscopy. Las colonoscopy 10 years ago, normal. The patient denies having any complaints such as melena, hematochezia, abdominal pain or distention, change in her bowel movement consistency or frequency, no changes in weight recently.  No family history of colorectal cancer.   Past Medical History:  Diagnosis Date   Arthritis    bilateral in hands   Asthma    history of childhood asthma   CTS (carpal tunnel syndrome)    Diabetes mellitus without complication (HCC)    Elevated liver enzymes    Fatty liver    High cholesterol    Hypertension     Past Surgical History:  Procedure Laterality Date   COLONOSCOPY N/A 05/05/2012   Procedure: COLONOSCOPY;  Surgeon: Malissa Hippo, MD;  Location: [facility];  Service: Endoscopy;  Laterality: N/A;  1030   TUBAL LIGATION      Family History  Problem Relation Age of Onset   Heart disease Father    Hypertension Father    Other Mother        died of natural causes - no known health issues   Colon cancer Neg Hx    Social History:  reports that she has never smoked. She has never used smokeless tobacco. She reports that she does not drink alcohol and does not use drugs.  Allergies:  Allergies  Allergen Reactions   Sulfonamide Derivatives Hives    Medications Prior to Admission  Medication Sig Dispense Refill   acetaminophen (TYLENOL) 650 MG CR tablet Take 1,300 mg by mouth every 8 (eight) hours as needed for pain.     albuterol (PROVENTIL) (2.5 MG/3ML) 0.083% nebulizer solution Take 3 mLs (2.5 mg total) by nebulization every 6 (six) hours as needed for wheezing or shortness of breath. 150 mL 1   Albuterol-Budesonide (AIRSUPRA) 90-80 MCG/ACT AERO Inhale 1-2 puffs into the lungs every 4 (four) hours as needed. 10.7 g 1   chlorpheniramine (CHLOR-TRIMETON) 4 MG tablet Take 8 mg by mouth 2 (two) times  daily.     Cholecalciferol (VITAMIN D3) 50 MCG (2000 UT) capsule Take 4,000 Units by mouth daily.     fluticasone furoate-vilanterol (BREO ELLIPTA) 100-25 MCG/ACT AEPB Inhale 1 puff into the lungs daily. 30 each 11   furosemide (LASIX) 20 MG tablet TAKE 1 TABLET(20 MG) BY MOUTH DAILY 90 tablet 0   ibuprofen (ADVIL) 800 MG tablet TAKE 1 TABLET BY MOUTH EVERY 8 HOURS AS NEEDED FOR PAIN 30 tablet 1   melatonin 5 MG TABS Take 10 mg by mouth See admin instructions. Take 10 mg prior to sleep     metFORMIN (GLUCOPHAGE) 500 MG tablet TAKE 1 TABLET(500 MG) BY MOUTH DAILY WITH BREAKFAST 90 tablet 0   montelukast (SINGULAIR) 10 MG tablet Take 10 mg by mouth at bedtime.     pantoprazole (PROTONIX) 40 MG tablet TAKE 1 TABLET(40 MG) BY MOUTH DAILY 30 TO 60 MINUTES BEFORE FIRST MEAL OF THE DAY 30 tablet 2   simvastatin (ZOCOR) 20 MG tablet TAKE 1 TABLET(20 MG) BY MOUTH DAILY 90 tablet 3    Results for orders placed or performed during the hospital encounter of 06/11/22 (from the past 48 hour(s))  Glucose, capillary     Status: Abnormal   Collection Time: 06/11/22  6:29 AM  Result Value Ref Range   Glucose-Capillary 68 (L) 70 - 99 mg/dL  Comment: Glucose reference range applies only to samples taken after fasting for at least 8 hours.  Glucose, capillary     Status: Abnormal   Collection Time: 06/11/22  7:14 AM  Result Value Ref Range   Glucose-Capillary 102 (H) 70 - 99 mg/dL    Comment: Glucose reference range applies only to samples taken after fasting for at least 8 hours.   No results found.  Review of Systems  All other systems reviewed and are negative.   Blood pressure (!) 143/90, temperature (!) 97.5 F (36.4 C), temperature source Oral, resp. rate (!) 23, SpO2 100 %. Physical Exam  GENERAL: The patient is AO x3, in no acute distress. HEENT: Head is normocephalic and atraumatic. EOMI are intact. Mouth is well hydrated and without lesions. NECK: Supple. No masses LUNGS: Clear to  auscultation. No presence of rhonchi/wheezing/rales. Adequate chest expansion HEART: RRR, normal s1 and s2. ABDOMEN: Soft, nontender, no guarding, no peritoneal signs, and nondistended. BS +. No masses. EXTREMITIES: Without any cyanosis, clubbing, rash, lesions or edema. NEUROLOGIC: AOx3, no focal motor deficit. SKIN: no jaundice, no rashes  Assessment/Plan  62 y/o F with PH DM, HTN, HLD, coming for screening colonoscopy. The patient is at average risk for colorectal cancer.  We will proceed with colonoscopy today.   Dolores Frame, MD 06/11/2022, 7:29 AM

## 2022-06-11 NOTE — Op Note (Signed)
[facility] Patient Name: Haley Mcgrath Procedure Date: 06/11/2022 7:14 AM MRN: [id number] Date of Birth: 09/23/1960 Attending MD: Katrinka Blazing , , [id number] CSN: [id number] Age: 62 Admit Type: Outpatient Procedure:                Colonoscopy Indications:              Screening for colorectal malignant neoplasm Providers:                Katrinka Blazing, Edrick Kins, RN, Dyann Ruddle Referring MD:              Medicines:                Monitored Anesthesia Care Complications:            No immediate complications. Estimated Blood Loss:     Estimated blood loss: none. Procedure:                Pre-Anesthesia Assessment:                           - Prior to the procedure, a History and Physical                            was performed, and patient medications, allergies                            and sensitivities were reviewed. The patient's                            tolerance of previous anesthesia was reviewed.                           - The risks and benefits of the procedure and the                            sedation options and risks were discussed with the                            patient. All questions were answered and informed                            consent was obtained.                           - ASA Grade Assessment: II - A patient with mild                            systemic disease.                           After obtaining informed consent, the colonoscope                            was passed under direct vision. Throughout the                            procedure, the patient's blood  pressure, pulse, and                            oxygen saturations were monitored continuously. The                            [id number]-[id number] ([id number]) scope was introduced through                            the anus and advanced to the the cecum, identified                            by appendiceal orifice and ileocecal valve. The                            colonoscopy was  performed without difficulty. The                            patient tolerated the procedure well. The quality                            of the bowel preparation was adequate to identify                            polyps greater than 5 mm in size. Scope In: 7:39:17 AM Scope Out: 7:58:18 AM Scope Withdrawal Time: 0 hours 13 minutes 55 seconds  Total Procedure Duration: 0 hours 19 minutes 1 second  Findings:      The perianal and digital rectal examinations were normal.      Scattered medium-mouthed and small-mouthed diverticula were found in the       sigmoid colon and descending colon.      The retroflexed view of the distal rectum and anal verge was normal and       showed no anal or rectal abnormalities. Impression:               - Diverticulosis in the sigmoid colon and in the                            descending colon.                           - The distal rectum and anal verge are normal on                            retroflexion view.                           - No specimens collected. Moderate Sedation:      Per Anesthesia Care Recommendation:           - Discharge patient to home (ambulatory).                           - Resume previous diet.                           -  Repeat colonoscopy in 10 years for screening                            purposes. Procedure Code(s):        --- Professional ---                           W0981, Colorectal cancer screening; colonoscopy on                            individual not meeting criteria for high risk Diagnosis Code(s):        --- Professional ---                           Z12.11, Encounter for screening for malignant                            neoplasm of colon                           K57.30, Diverticulosis of large intestine without                            perforation or abscess without bleeding CPT copyright 2022 American Medical Association. All rights reserved. The codes documented in this report are preliminary and  upon coder review may  be revised to meet current compliance requirements. Katrinka Blazing, MD Katrinka Blazing,  06/11/2022 8:05:08 AM This report has been signed electronically. Number of Addenda: 0

## 2022-06-11 NOTE — Anesthesia Preprocedure Evaluation (Signed)
Anesthesia Evaluation  Patient identified by MRN, date of birth, ID band Patient awake    Reviewed: Allergy & Precautions, H&P , NPO status , Patient's Chart, lab work & pertinent test results, reviewed documented beta blocker date and time   Airway Mallampati: II  TM Distance: >3 FB Neck ROM: full    Dental no notable dental hx.    Pulmonary neg pulmonary ROS, asthma    Pulmonary exam normal breath sounds clear to auscultation       Cardiovascular Exercise Tolerance: Good hypertension, negative cardio ROS  Rhythm:regular Rate:Normal     Neuro/Psych  Neuromuscular disease negative neurological ROS  negative psych ROS   GI/Hepatic negative GI ROS, Neg liver ROS,,,  Endo/Other  diabetes  Morbid obesity  Renal/GU negative Renal ROS  negative genitourinary   Musculoskeletal   Abdominal   Peds  Hematology negative hematology ROS (+)   Anesthesia Other Findings   Reproductive/Obstetrics negative OB ROS                             Anesthesia Physical Anesthesia Plan  ASA: 3  Anesthesia Plan: General   Post-op Pain Management:    Induction:   PONV Risk Score and Plan: Propofol infusion  Airway Management Planned:   Additional Equipment:   Intra-op Plan:   Post-operative Plan:   Informed Consent: I have reviewed the patients History and Physical, chart, labs and discussed the procedure including the risks, benefits and alternatives for the proposed anesthesia with the patient or authorized representative who has indicated his/her understanding and acceptance.     Dental Advisory Given  Plan Discussed with: CRNA  Anesthesia Plan Comments:        Anesthesia Quick Evaluation

## 2022-06-12 NOTE — Anesthesia Postprocedure Evaluation (Signed)
Anesthesia Post Note  Patient: Haley Mcgrath  Procedure(s) Performed: COLONOSCOPY WITH PROPOFOL  Patient location during evaluation: Phase II Anesthesia Type: General Level of consciousness: awake Pain management: pain level controlled Vital Signs Assessment: post-procedure vital signs reviewed and stable Respiratory status: spontaneous breathing and respiratory function stable Cardiovascular status: blood pressure returned to baseline and stable Postop Assessment: no headache and no apparent nausea or vomiting Anesthetic complications: no Comments: Late entry   No notable events documented.   Last Vitals:  Vitals:   06/11/22 0638 06/11/22 0803  BP: (!) 143/90 110/82  Pulse:  75  Resp: (!) 23 12  Temp: (!) 36.4 C (!) 36.4 C  SpO2: 100% 100%    Last Pain:  Vitals:   06/11/22 0802  TempSrc:   PainSc: 0-No pain                 Windell Norfolk

## 2022-06-18 ENCOUNTER — Encounter (HOSPITAL_COMMUNITY): Payer: Self-pay | Admitting: Gastroenterology

## 2022-07-18 IMAGING — MG MM DIGITAL SCREENING BILAT W/ TOMO AND CAD
8 of 18 series · 8 of 40 positions shown · non-contrast
Comparison: Previous exam(s).

CLINICAL DATA: Screening.

EXAM:
DIGITAL SCREENING BILATERAL MAMMOGRAM WITH TOMOSYNTHESIS AND CAD
TECHNIQUE: Bilateral screening digital craniocaudal and mediolateral oblique
mammograms were obtained. Bilateral screening digital breast
tomosynthesis was performed. The images were evaluated with
computer-aided detection.

[R CC synth-2D]
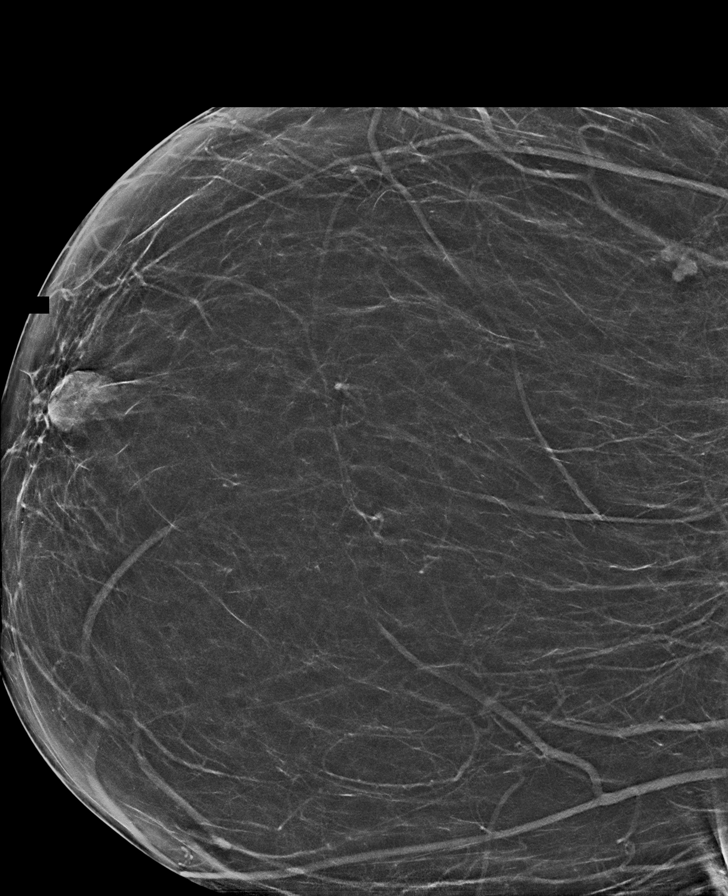

[R CV synth-2D]
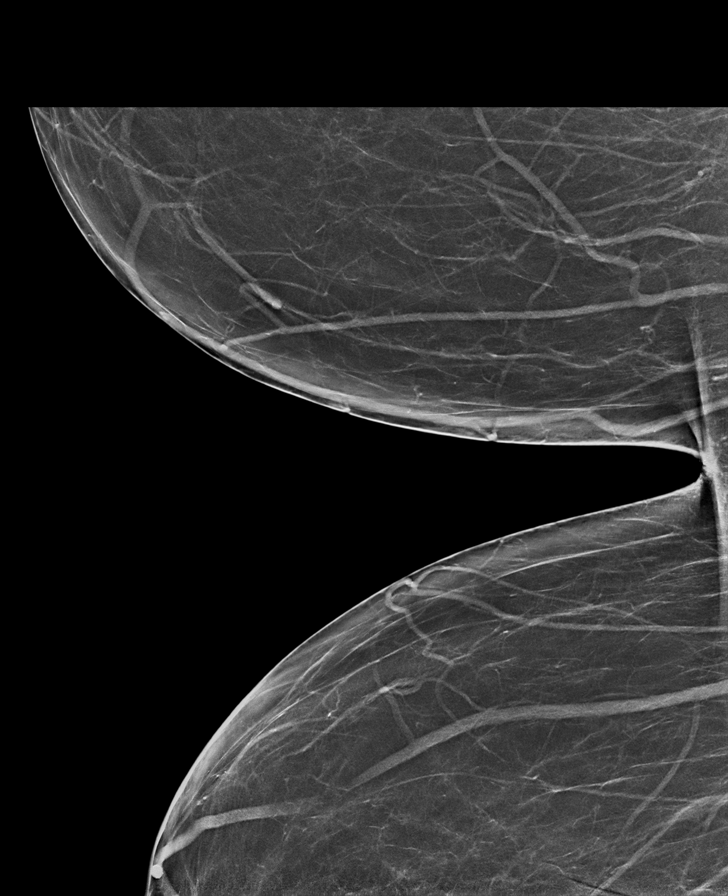

[L MLO synth-2D (1 of 2)]
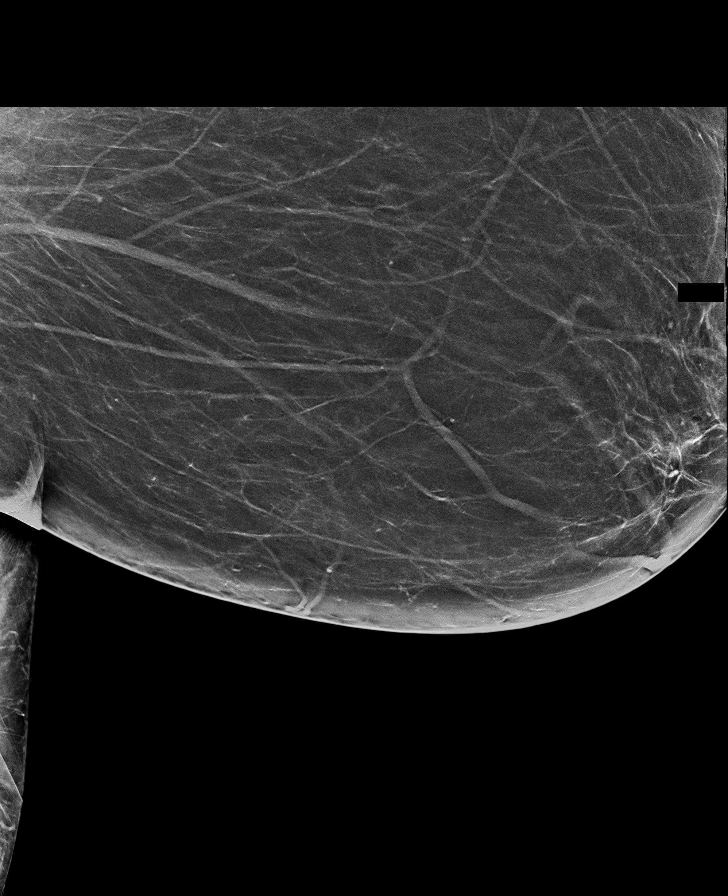

[L MLO synth-2D (2 of 2)]
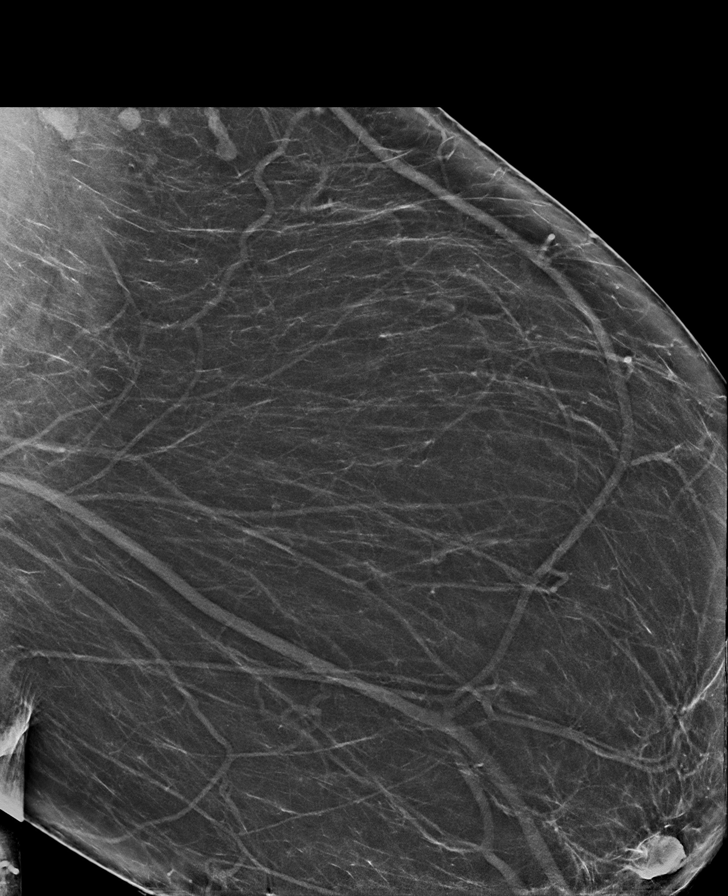

[R MLO synth-2D (1 of 2)]
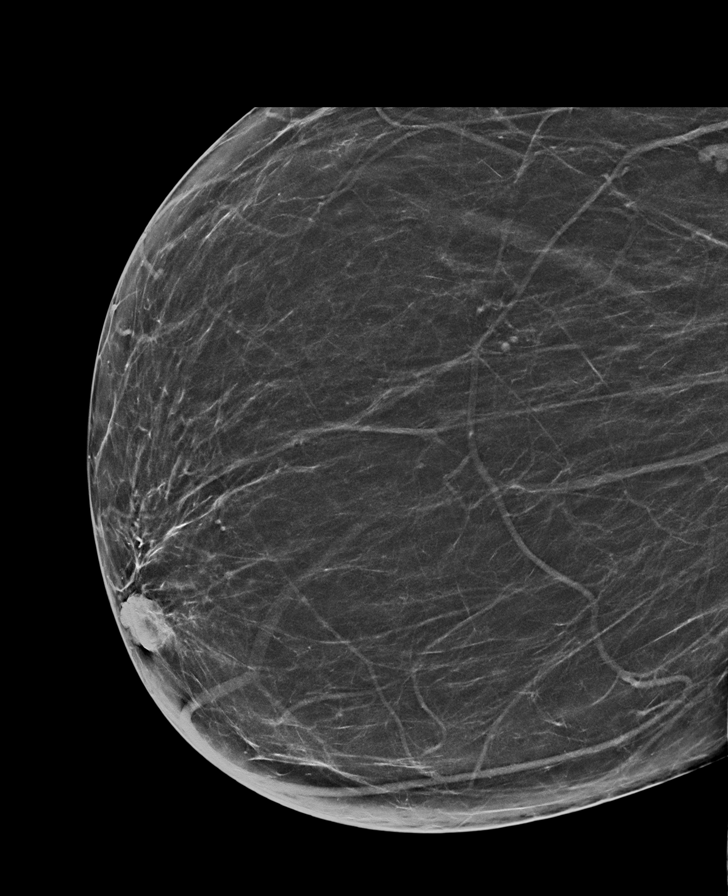

[R MLO synth-2D (2 of 2)]
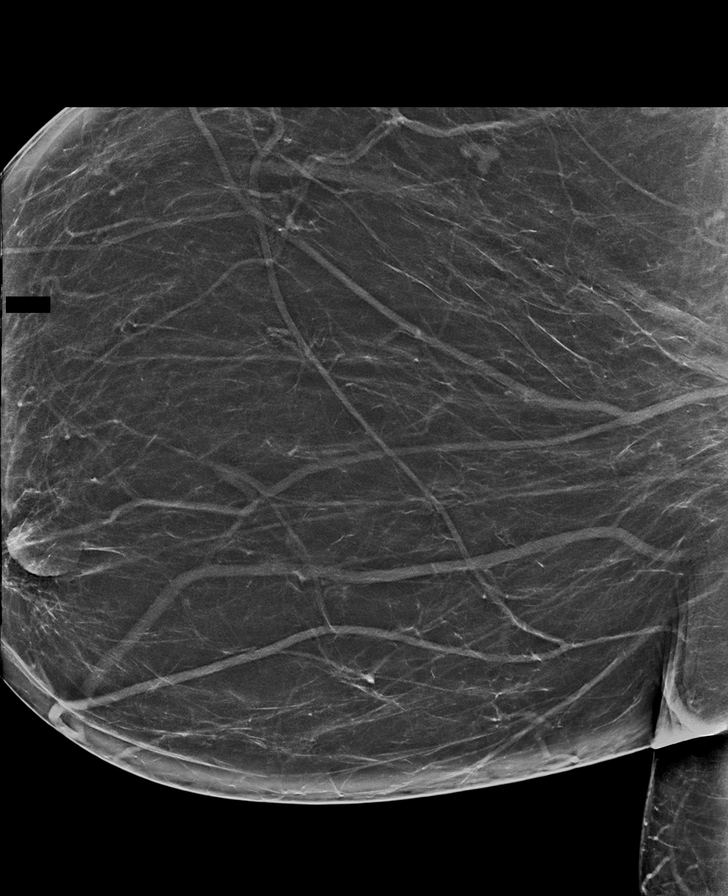

[L CC synth-2D]
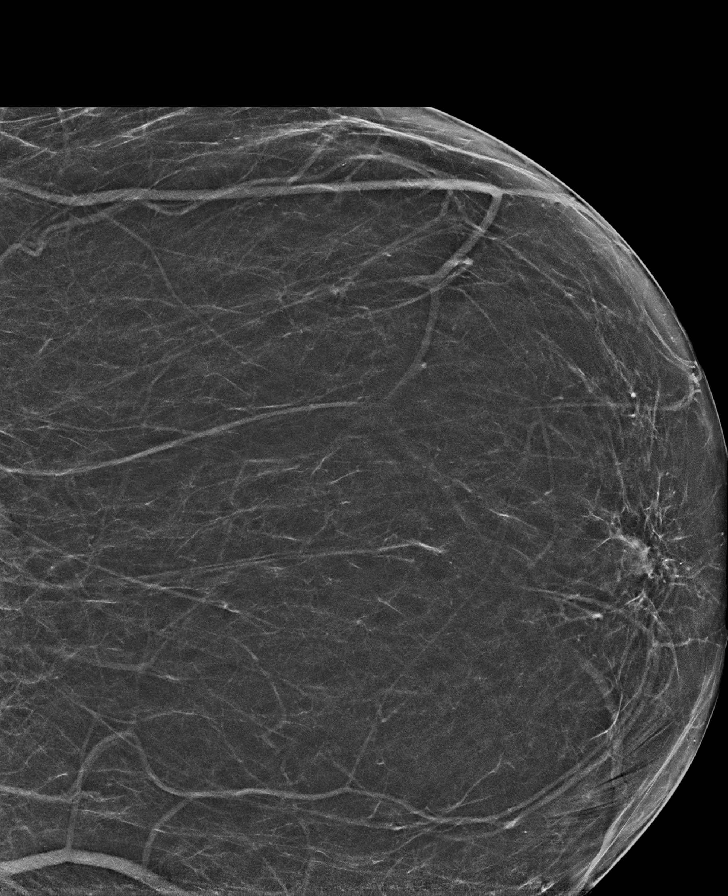

[R MLO tomo · tomo slice 41/80.0]
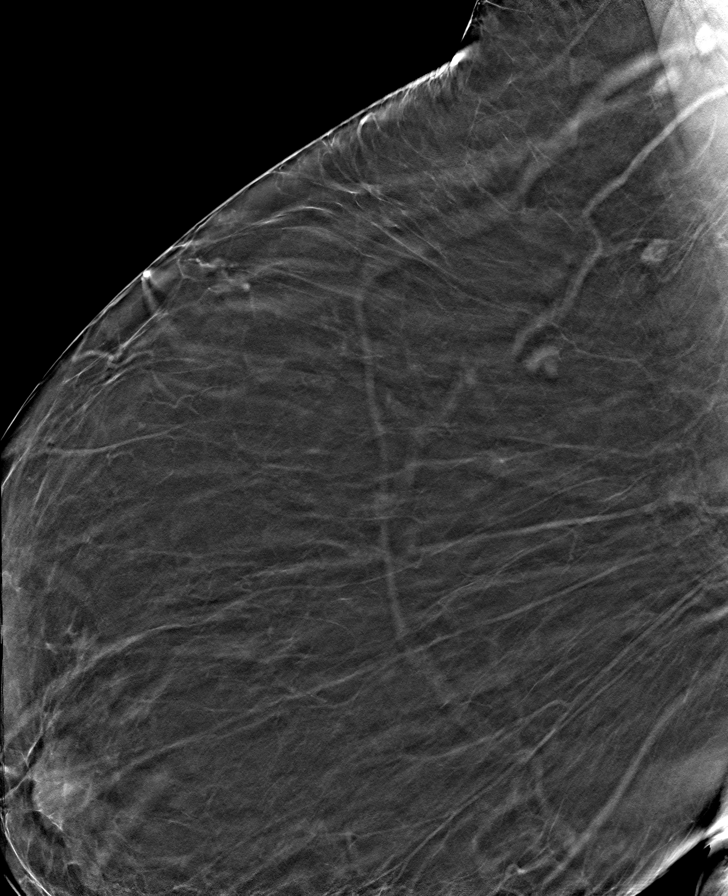

[8 of 40 positions shown; findings below may reference images not displayed]

ACR Breast Density Category b: There are scattered areas of
fibroglandular density.
FINDINGS: There are no findings suspicious for malignancy.
IMPRESSION: No mammographic evidence of malignancy. A result letter of this
screening mammogram will be mailed directly to the patient.

RECOMMENDATION:
Screening mammogram in one year. (Code:51-O-LD2)

BI-RADS CATEGORY  1: Negative.

## 2022-07-30 NOTE — Progress Notes (Signed)
Haley Mcgrath, female    DOB: [birth year]   MRN: [id number]   Brief patient profile:  38 yobf  never smoker always lived in [place] was told asthma as child and did fine in school then saw allergist in her 20's for recurrent cough does not remember findings/ recs but ever since then up to 2 x a year develops severe paroxysmal cough lasting up to a month  Typically "starts like a head cold" and goes into chest so referred to pulmonary clinic in [place]  02/12/2020 by Dr   Jeanice Lim    History of Present Illness  02/12/2020  Pulmonary/ 1st office eval/ [facility] / Sales executive Complaint  Patient presents with   Consult    Had non productive cough for about a month which has no resolved  Dyspnea:  Not limited by breathing from desired activities  / works as Chief Strategy Officer nights  Cough: no  Sleep: able to sleep flat bed couple of pillows  SABA use: none now rec Pantoprazole (protonix) 40 mg   Take  30-60 min before first meal of the day and Pepcid (famotidine)  20 mg one after supper  until return to office - this is the best way to tell whether stomach acid is contributing to your problem.   GERD diet  For breathing >>>Only use your albuterol as a rescue medication to be used if you can't catch your breath  For coughing >  tessilon 200 mg up to three times a day For drainage / throat tickle/sneezing  try take CHLORPHENIRAMINE  4 mg         12/24/2021  f/u ov/[place] [facility]/C[facility]a R[facility] re: asthma since child maint on dulera 100 2bid and singulair  - pred 3-4 m prior to OV   and no longer on  gabapentin   Chief Complaint  Patient presents with   Follow-up    Asthma is doing better since last ov   Dyspnea:  still working 12 h all night, up ladders s sob  Cough: none  Sleeping: no resp cc  SABA use: rarely  02: none  Rec Dulera 100 Take 2 puffs first thing in am and then another 2 puffs about 12 hours later.  In event of attack continue dulera as above and add Air supra up to 2 puffs  every 4 hours as needed (instead of albuterol like you have done in the past) Work on inhaler technique:      07/31/2022  f/u ov/[place] office/Courvoisier [facility]len re: asthma maint on Breo 100 one daily   Chief Complaint  Patient presents with   Follow-up  Dyspnea:  job ok including ladders  Cough: not a problem  Sleeping: on risers/ 2 pillows  SABA use: none    No obvious day to day or daytime variability or assoc excess/ purulent sputum or mucus plugs or hemoptysis or cp or chest tightness, subjective wheeze or overt sinus or hb symptoms.   Sleeping  without nocturnal  or early am exacerbation  of respiratory  c/o's or need for noct saba. Also denies any obvious fluctuation of symptoms with weather or environmental changes or other aggravating or alleviating factors except as outlined above   No unusual exposure hx or h/o childhood pna or knowledge of premature birth.  Current Allergies, Complete Past Medical History, Past Surgical History, Family History, and Social History were reviewed in [employer] record.  ROS  The following are not active complaints unless bolded Hoarseness, sore throat, dysphagia,  dental problems, itching, sneezing,  nasal congestion or discharge of excess mucus or purulent secretions, ear ache,   fever, chills, sweats, unintended wt loss or wt gain, classically pleuritic or exertional cp,  orthopnea pnd or arm/hand swelling  or leg swelling, presyncope, palpitations, abdominal pain, anorexia, nausea, vomiting, diarrhea  or change in bowel habits or change in bladder habits, change in stools or change in urine, dysuria, hematuria,  rash, arthralgias, visual complaints, headache, numbness, weakness or ataxia or problems with walking or coordination,  change in mood or  memory.        Current Meds  Medication Sig   acetaminophen (TYLENOL) 650 MG CR tablet Take 1,300 mg by mouth every 8 (eight) hours as needed for pain.   albuterol (PROVENTIL) (2.5  MG/3ML) 0.083% nebulizer solution Take 3 mLs (2.5 mg total) by nebulization every 6 (six) hours as needed for wheezing or shortness of breath.   Albuterol-Budesonide (AIRSUPRA) 90-80 MCG/ACT AERO Inhale 1-2 puffs into the lungs every 4 (four) hours as needed.   chlorpheniramine (CHLOR-TRIMETON) 4 MG tablet Take 8 mg by mouth 2 (two) times daily.   Cholecalciferol (VITAMIN D3) 50 MCG (2000 UT) capsule Take 4,000 Units by mouth daily.   fluticasone furoate-vilanterol (BREO ELLIPTA) 100-25 MCG/ACT AEPB Inhale 1 puff into the lungs daily.   furosemide (LASIX) 20 MG tablet TAKE 1 TABLET(20 MG) BY MOUTH DAILY   ibuprofen (ADVIL) 800 MG tablet TAKE 1 TABLET BY MOUTH EVERY 8 HOURS AS NEEDED FOR PAIN   melatonin 5 MG TABS Take 10 mg by mouth See admin instructions. Take 10 mg prior to sleep   metFORMIN (GLUCOPHAGE) 500 MG tablet TAKE 1 TABLET(500 MG) BY MOUTH DAILY WITH BREAKFAST   montelukast (SINGULAIR) 10 MG tablet Take 10 mg by mouth at bedtime.   simvastatin (ZOCOR) 20 MG tablet TAKE 1 TABLET(20 MG) BY MOUTH DAILY           Past Medical History:  Diagnosis Date   Arthritis    bilateral in hands   Asthma    history of childhood asthma   CTS (carpal tunnel syndrome)    Elevated liver enzymes    Fatty liver    High cholesterol    Hypertension      Objective:    Wts  07/31/2022      272   03/19/2022      263   12/24/2021     271  12/06/2020       275  04/09/2020         267   03/26/2020       275  02/12/20 276 lb 3.2 oz (125.3 kg)  12/18/19 272 lb (123.4 kg)  10/05/19 266 lb (120.7 kg)    Vital signs reviewed  07/31/2022  - Note at rest 02 sats  96% on RA   General appearance:    MO (by BMI) amb bf nad   HEENT : Oropharynx  clear         NECK :  without  apparent JVD/ palpable Nodes/TM    LUNGS: no acc muscle use,  Nl contour chest which is clear to A and P bilaterally without cough on insp or exp maneuvers   CV:  RRR  no s3 or murmur or increase in P2, and no edema   ABD:   obese soft and nontender    MS:  Nl gait/ ext warm without deformities Or obvious joint restrictions  calf tenderness, cyanosis or clubbing  SKIN: warm and dry without lesions    NEURO:  alert, approp, nl sensorium with  no motor or cerebellar deficits apparent.           Assessment

## 2022-07-31 ENCOUNTER — Encounter: Payer: Self-pay | Admitting: Internal Medicine

## 2022-07-31 ENCOUNTER — Ambulatory Visit: Payer: BC Managed Care – PPO | Admitting: Internal Medicine

## 2022-07-31 VITALS — BP 139/82 | HR 85 | Ht 67.0 in | Wt 272.0 lb

## 2022-07-31 DIAGNOSIS — J45991 Cough variant asthma: Secondary | ICD-10-CM

## 2022-07-31 NOTE — Assessment & Plan Note (Signed)
Body mass index is 42.6 kg/m.  -  trending up  Lab Results  Component Value Date   TSH 1.920 10/05/2019      Contributing to doe and risk of GERD/ worse djd >>>   reviewed the need and the process to achieve and maintain neg calorie balance > defer f/u primary care including intermittently monitoring thyroid status             Each maintenance medication was reviewed in detail including emphasizing most importantly the difference between maintenance and prns and under what circumstances the prns are to be triggered using an action plan format where appropriate.  Total time for H and P, chart review, counseling, reviewing dpi/hfa device(s) and generating customized AVS unique to this office visit / same day charting = 20 min

## 2022-07-31 NOTE — Patient Instructions (Signed)
Remember ok to use AirSupra up to 2 puff every 4 hours if needed or up to 12 puffs per day  Please schedule a follow up visit in 12  months but call sooner if needed

## 2022-07-31 NOTE — Assessment & Plan Note (Signed)
Onset in her 20's with background of ? Asthma as child but no seasonal pattern -  Allergy profile 02/12/2020 >  Eos 0.3 /  IgE 43   - 03/26/2020    try dulera 100 2bid  - 04/09/2020  After extensive coaching inhaler device,  effectiveness =    75% try dulera 100 one bid plus build up gabapentin to 100 qid > much better until covid early June 2022  - 12/24/2021  After extensive coaching inhaler device,  effectiveness =  75% from a baseline of 25%) short Ti > try airsupra as rescue -  12/31/21 changed to BREO 100 by insurance > 03/19/2022 reported improvement on Breo 100  All goals of chronic asthma control met including optimal function and elimination of symptoms with minimal need for rescue therapy.  Contingencies discussed in full including contacting this office immediately if not controlling the symptoms using the rule of two's.     F/u can be yearly

## 2022-11-10 ENCOUNTER — Other Ambulatory Visit (HOSPITAL_COMMUNITY): Payer: Self-pay | Admitting: Family Medicine

## 2022-11-10 DIAGNOSIS — R609 Edema, unspecified: Secondary | ICD-10-CM

## 2022-11-16 ENCOUNTER — Ambulatory Visit (HOSPITAL_COMMUNITY)
Admission: RE | Admit: 2022-11-16 | Discharge: 2022-11-16 | Disposition: A | Discharge status: Home / Self Care | Payer: BC Managed Care – PPO | Source: Ambulatory Visit | Attending: Family Medicine | Admitting: Family Medicine

## 2022-11-16 DIAGNOSIS — R609 Edema, unspecified: Secondary | ICD-10-CM | POA: Diagnosis present

## 2023-01-07 ENCOUNTER — Other Ambulatory Visit (HOSPITAL_COMMUNITY): Payer: Self-pay | Admitting: Family Medicine

## 2023-01-07 DIAGNOSIS — Z1231 Encounter for screening mammogram for malignant neoplasm of breast: Secondary | ICD-10-CM

## 2023-01-25 ENCOUNTER — Ambulatory Visit (HOSPITAL_COMMUNITY)
Admission: RE | Admit: 2023-01-25 | Discharge: 2023-01-25 | Disposition: A | Discharge status: Home / Self Care | Payer: BC Managed Care – PPO | Source: Ambulatory Visit | Attending: Family Medicine | Admitting: Family Medicine

## 2023-01-25 DIAGNOSIS — Z1231 Encounter for screening mammogram for malignant neoplasm of breast: Secondary | ICD-10-CM | POA: Insufficient documentation

## 2023-04-12 ENCOUNTER — Other Ambulatory Visit (HOSPITAL_COMMUNITY): Payer: Self-pay | Admitting: Family Medicine

## 2023-04-12 DIAGNOSIS — M25532 Pain in left wrist: Secondary | ICD-10-CM

## 2023-04-28 ENCOUNTER — Encounter (HOSPITAL_COMMUNITY): Payer: Self-pay

## 2023-04-28 ENCOUNTER — Ambulatory Visit (HOSPITAL_COMMUNITY): Attending: Family Medicine

## 2023-10-27 ENCOUNTER — Other Ambulatory Visit (HOSPITAL_COMMUNITY): Payer: Self-pay | Admitting: Family Medicine

## 2023-10-27 DIAGNOSIS — E785 Hyperlipidemia, unspecified: Secondary | ICD-10-CM

## 2023-11-15 ENCOUNTER — Ambulatory Visit (HOSPITAL_COMMUNITY)
Admission: RE | Admit: 2023-11-15 | Discharge: 2023-11-15 | Disposition: A | Discharge status: Home / Self Care | Payer: Self-pay | Source: Ambulatory Visit | Attending: Family Medicine | Admitting: Family Medicine

## 2023-11-15 DIAGNOSIS — E785 Hyperlipidemia, unspecified: Secondary | ICD-10-CM | POA: Insufficient documentation

## 2023-11-15 MED ORDER — IOHEXOL 9 MG/ML PO SOLN
ORAL | Status: AC
  Filled 2023-11-15: qty 1000

## 2023-11-17 ENCOUNTER — Encounter (INDEPENDENT_AMBULATORY_CARE_PROVIDER_SITE_OTHER): Payer: Self-pay | Admitting: Gastroenterology

## 2023-12-16 ENCOUNTER — Ambulatory Visit (HOSPITAL_COMMUNITY)
Admission: RE | Admit: 2023-12-16 | Discharge: 2023-12-16 | Disposition: A | Discharge status: Home / Self Care | Source: Ambulatory Visit

## 2023-12-16 ENCOUNTER — Other Ambulatory Visit (HOSPITAL_COMMUNITY): Payer: Self-pay

## 2023-12-16 DIAGNOSIS — R058 Other specified cough: Secondary | ICD-10-CM | POA: Insufficient documentation

## 2024-01-20 ENCOUNTER — Encounter: Payer: Self-pay | Admitting: Internal Medicine

## 2024-01-20 ENCOUNTER — Ambulatory Visit: Admitting: Internal Medicine

## 2024-01-20 VITALS — BP 149/79 | HR 71 | Ht 67.0 in | Wt 272.0 lb

## 2024-01-20 DIAGNOSIS — Z6841 Body Mass Index (BMI) 40.0 and over, adult: Secondary | ICD-10-CM

## 2024-01-20 DIAGNOSIS — J45991 Cough variant asthma: Secondary | ICD-10-CM | POA: Diagnosis not present

## 2024-01-20 MED ORDER — METHYLPREDNISOLONE ACETATE 80 MG/ML IJ SUSP
120.0000 mg | Freq: Once | INTRAMUSCULAR | Status: AC
  Administered 2024-01-20: 09:00:00 120 mg via INTRAMUSCULAR

## 2024-01-20 NOTE — Progress Notes (Unsigned)
 Haley Mcgrath, female    DOB: 12-24-1960   MRN: [id number]   Brief patient profile:  16 yobf  never smoker always lived in [place] was told asthma as child and did fine in school then saw allergist in her 20's for recurrent cough does not remember findings/ recs but ever since then up to 2 x a year develops severe paroxysmal cough lasting up to a month  Typically starts like a head cold and goes into chest so referred to pulmonary clinic in [place]  02/12/2020 by Dr   Bari    History of Present Illness  02/12/2020  Pulmonary/ 1st office eval/ Haley Mcgrath / Sales Executive Complaint  Patient presents with   Consult    Had non productive cough for about a month which has no resolved  Dyspnea:  Not limited by breathing from desired activities  / works as chief strategy officer nights  Cough: no  Sleep: able to sleep flat bed couple of pillows  SABA use: none now rec Pantoprazole  (protonix ) 40 mg   Take  30-60 min before first meal of the day and Pepcid  (famotidine )  20 mg one after supper  until return to office - this is the best way to tell whether stomach acid is contributing to your problem.   GERD diet  For breathing >>>Only use your albuterol  as a rescue medication to be used if you can't catch your breath  For coughing >  tessilon 200 mg up to three times a day For drainage / throat tickle/sneezing  try take CHLORPHENIRAMINE  4 mg        07/31/2022  f/u ov/[name] office/Haley Mcgrath re: asthma maint on Breo 100 one daily   Chief Complaint  Patient presents with   Follow-up  Dyspnea:  job ok including ladders  Cough: not a problem  Sleeping: on risers/ 2 pillows  SABA use: none  Rec Remember ok to use AirSupra  up to 2 puff every 4 hours if needed or up to 12 puffs per day  [facility]   01/20/2024 re-establish  ov/[place] office/Haley Mcgrath re: asthma with UACS maint on ?  Chief Complaint  Patient presents with   Cough    Doing worse - non  productive    Dyspnea:  walks on the job ok  Cough: worse since summer maint singulair  worse as day goes on  Sleeping: bed blocks and 3 pillows initially coughs then up to an hour  but typically will recur / perfume will set it off  SABA use: taking something right now  02: none      No obvious day to day or daytime variability or assoc excess/ purulent sputum or mucus plugs or hemoptysis or cp or chest tightness, subjective wheeze or overt sinus or hb symptoms.    Also denies any obvious fluctuation of symptoms with weather or environmental changes or other aggravating or alleviating factors except as outlined above   No unusual exposure hx or h/o childhood pna knowledge of premature birth.  Current Allergies, Complete Past Medical History, Past Surgical History, Family History, and Social History were reviewed in [employer] record.  ROS  The following are not active complaints unless bolded Hoarseness, sore throat, dysphagia, dental problems, itching, sneezing,  nasal congestion or discharge of excess mucus or purulent secretions, ear ache,   fever, chills, sweats, unintended wt loss or wt gain, classically pleuritic or exertional cp,  orthopnea pnd or arm/hand swelling  or leg swelling, presyncope, palpitations, abdominal pain, anorexia, nausea, vomiting, diarrhea  or change in bowel habits or change in bladder habits, change in stools or change in urine, dysuria, hematuria,  rash, arthralgias, visual complaints, headache, numbness, weakness or ataxia or problems with walking or coordination,  change in mood or  memory.         Outpatient Medications Prior to Visit  Medication Sig Dispense Refill   montelukast  (SINGULAIR ) 10 MG tablet Take 10 mg by mouth at bedtime.     pantoprazole  (PROTONIX ) 40 MG tablet TAKE 1 TABLET(40 MG) BY MOUTH DAILY 30 TO 60 MINUTES BEFORE FIRST MEAL OF THE DAY 30 tablet 2   simvastatin  (ZOCOR ) 20 MG tablet TAKE 1 TABLET(20 MG) BY MOUTH  DAILY 90 tablet 3   acetaminophen  (TYLENOL ) 650 MG CR tablet Take 1,300 mg by mouth every 8 (eight) hours as needed for pain.     albuterol  (PROVENTIL ) (2.5 MG/3ML) 0.083% nebulizer solution Take 3 mLs (2.5 mg total) by nebulization every 6 (six) hours as needed for wheezing or shortness of breath. 150 mL 1   Cholecalciferol (VITAMIN D3) 50 MCG (2000 UT) capsule Take 4,000 Units by mouth daily.     furosemide  (LASIX ) 20 MG tablet TAKE 1 TABLET(20 MG) BY MOUTH DAILY 90 tablet 0   ibuprofen  (ADVIL ) 800 MG tablet TAKE 1 TABLET BY MOUTH EVERY 8 HOURS AS NEEDED FOR PAIN 30 tablet 1   melatonin 5 MG TABS Take 10 mg by mouth See admin instructions. Take 10 mg prior to sleep     metFORMIN  (GLUCOPHAGE ) 500 MG tablet TAKE 1 TABLET(500 MG) BY MOUTH DAILY WITH BREAKFAST 90 tablet 0   Albuterol -Budesonide  (AIRSUPRA ) 90-80 MCG/ACT AERO Inhale 1-2 puffs into the lungs every 4 (four) hours as needed. (Patient not taking: Reported on 01/20/2024) 10.7 g 1   chlorpheniramine (CHLOR-TRIMETON) 4 MG tablet Take 8 mg by mouth 2 (two) times daily.     fluticasone  furoate-vilanterol (BREO ELLIPTA ) 100-25 MCG/ACT AEPB Inhale 1 puff into the lungs daily. 30 each 11   No facility-administered medications prior to visit.       Past Medical History:  Diagnosis Date   Arthritis    bilateral in hands   Asthma    history of childhood asthma   CTS (carpal tunnel syndrome)    Elevated liver enzymes    Fatty liver    High cholesterol    Hypertension      Objective:    Wts  01/20/2024    *** 07/31/2022      272   03/19/2022      263   12/24/2021     271  12/06/2020       275  04/09/2020         267   03/26/2020       275  02/12/20 276 lb 3.2 oz (125.3 kg)  12/18/19 272 lb (123.4 kg)  10/05/19 266 lb (120.7 kg)    Vital signs reviewed  01/20/2024  - Note at rest 02 sats  ***% on ***   General appearance:    ***             Assessment

## 2024-01-20 NOTE — Patient Instructions (Addendum)
 Call me with discrepancies on your medication list and the name of the cough specialist you plan to see   For drainage / throat tickle try take CHLORPHENIRAMINE  4 mg  (Allergy Relief 4mg   at [facility] should be easiest to find in the blue box usually on bottom shelf)  take one every 4 hours as needed - extremely effective and inexpensive over the counter- may cause drowsiness so start with  two an hour before bedtime and see how you tolerate it before trying in daytime.   SABRAGERD (REFLUX)  is an extremely common cause of respiratory symptoms just like yours, many times with no obvious heartburn at all.    It can be treated with medication, but also with lifestyle changes including elevation of the head of your bed (ideally with 6 -8inch blocks under the headboard of your bed),  Smoking cessation, avoidance of late meals, excessive alcohol, and avoid fatty foods, chocolate, peppermint, colas, red wine, and acidic juices such as orange juice.  NO MINT OR MENTHOL PRODUCTS SO NO COUGH DROPS  USE SUGARLESS CANDY INSTEAD (Jolley ranchers or Stover's or Life Savers) or even ice chips will also do - the key is to swallow to prevent all throat clearing. NO OIL BASED VITAMINS - use powdered substitutes.  Avoid fish oil when coughing.   Depomedrol 120 mg IM

## 2024-01-22 NOTE — Assessment & Plan Note (Addendum)
 Body mass index is 42.6 kg/m.  -   Lab Results  Component Value Date   TSH 1.920 10/05/2019   Contributing to doe and risk of GERD/dvt/ PE  >>>   reviewed the need and the process to achieve and maintain neg calorie balance > defer f/u primary care including intermittently monitoring thyroid  status

## 2024-01-22 NOTE — Assessment & Plan Note (Addendum)
 Onset in her 20's with background of ? Asthma as child but no seasonal pattern -  Allergy profile 02/12/2020 >  Eos 0.3 /  IgE 43   - 03/26/2020    try dulera 100 2bid  - 04/09/2020  After extensive coaching inhaler device,  effectiveness =    75% try dulera 100 one bid plus build up gabapentin  to 100 qid > much better until covid early June 2022  - 12/24/2021  After extensive coaching inhaler device,  effectiveness =  75% from a baseline of 25%) short Ti > try airsupra  as rescue -  12/31/21 changed to BREO 100 by insurance > 03/19/2022 reported improvement on Breo 100  Really unclear what she's taking or who she's seeing at this point for what is most likely not asthma but rather Upper airway cough syndrome (previously labeled PNDS),  is so named because it's frequently impossible to sort out how much is  CR/sinusitis with freq throat clearing (which can be related to primary GERD)   vs  causing  secondary ( extra esophageal)  GERD from wide swings in gastric pressure that occur with throat clearing, often  promoting self use of mint and menthol lozenges that reduce the lower esophageal sphincter tone and exacerbate the problem further in a cyclical fashion.   These are the same pts (now being labeled as having irritable larynx syndrome by some cough centers) who not infrequently have a history of having failed to tolerate ace inhibitors,  dry powder inhalers (as may be the case here) or biphosphonates or report having atypical/extraesophageal reflux symptoms from LPR (globus, throat clearing)  that don't respond to standard doses of PPI  and are easily confused as having aecopd or asthma flares by even experienced allergists/ pulmonologists (myself included).   For now rec  1)  instructed on how to do med reconciliation like balancing a checkbook and call back with discrepancies  2)  Depomedrol 120 mg IM but no more steroids for now   3)  Add 1st gen H1 blockers per guidelines    4) max gerd  rx/hard rock candy to suppress throat clearing  5) advised: The standardized cough guidelines published in Chest by Charlie Coder in 2006 are still the best available and consist of a multiple step process (up to 12!) , not a single office visit,  and are intended  to address this problem logically,  with an alogrithm dependent on response to empiric treatment at  each progressive step  to determine a specific diagnosis with  minimal addtional testing needed. Therefore if adherence is an issue or can't be accurately verified,  it's very unlikely the standard evaluation and treatment will be successful here.    Furthermore, response to therapy (other than acute cough suppression, which should only be used short term with avoidance of narcotic containing cough syrups if possible), can be a gradual process for which the patient is not likely to  perceive immediate benefit.  Unlike going to an eye doctor where the best perscription is almost always the first one and is immediately effective, this is almost never the case in the management of chronic cough syndromes. Therefore the patient needs to commit up front to consistently adhere to recommendations  for up to 6 weeks of therapy directed at the likely underlying problem(s) before the response can be reasonably evaluated.   F/u here prn  with all meds in hand using a trust but verify approach to confirm accurate Medication  Reconciliation The  principal here is that until we are certain that the  patients are doing what we've asked, it makes no sense to ask them to do more.
